# Patient Record
Sex: Male | Born: 1990
Health system: Southern US, Community
[De-identification: ages and names within clinical notes are randomized; demographics above are authoritative.]

## PROBLEM LIST (undated history)

## (undated) DIAGNOSIS — A749 Chlamydial infection, unspecified: Secondary | ICD-10-CM

## (undated) DIAGNOSIS — A64 Unspecified sexually transmitted disease: Secondary | ICD-10-CM

---

## 2011-07-17 ENCOUNTER — Emergency Department (HOSPITAL_BASED_OUTPATIENT_CLINIC_OR_DEPARTMENT_OTHER)
Admission: EM | Admit: 2011-07-17 | Discharge: 2011-07-17 | Disposition: A | Discharge status: Home / Self Care | Payer: Self-pay | Attending: Emergency Medicine | Admitting: Emergency Medicine

## 2011-07-17 ENCOUNTER — Encounter: Payer: Self-pay | Admitting: Emergency Medicine

## 2011-07-17 DIAGNOSIS — R300 Dysuria: Secondary | ICD-10-CM | POA: Insufficient documentation

## 2011-07-17 DIAGNOSIS — N342 Other urethritis: Secondary | ICD-10-CM | POA: Insufficient documentation

## 2011-07-17 LAB — URINALYSIS, ROUTINE W REFLEX MICROSCOPIC
Bilirubin Urine: NEGATIVE
Glucose, UA: NEGATIVE mg/dL
Hgb urine dipstick: NEGATIVE
Ketones, ur: 40 mg/dL — AB
Leukocytes, UA: NEGATIVE
Nitrite: NEGATIVE
Protein, ur: NEGATIVE mg/dL
Specific Gravity, Urine: 1.023 (ref 1.005–1.030)
Urobilinogen, UA: 1 mg/dL (ref 0.0–1.0)
pH: 6.5 (ref 5.0–8.0)

## 2011-07-17 MED ORDER — AZITHROMYCIN 1 G PO PACK: 1.0000 g | PACK | Freq: Once | ORAL | Status: DC

## 2011-07-17 MED ORDER — CEFTRIAXONE SODIUM 250 MG IJ SOLR
250.0000 mg | Freq: Once | INTRAMUSCULAR | Status: AC
  Administered 2011-07-17: 250 mg via INTRAMUSCULAR
  Filled 2011-07-17: qty 250

## 2011-07-17 MED ORDER — AZITHROMYCIN 250 MG PO TABS
ORAL_TABLET | ORAL | Status: AC
  Administered 2011-07-17: 1000 mg via ORAL
  Filled 2011-07-17: qty 4

## 2011-07-17 MED ORDER — AZITHROMYCIN 250 MG PO TABS
1000.0000 mg | ORAL_TABLET | Freq: Once | ORAL | Status: AC
  Administered 2011-07-17: 1000 mg via ORAL

## 2011-07-17 MED ORDER — LIDOCAINE HCL (PF) 1 % IJ SOLN
INTRAMUSCULAR | Status: AC
  Administered 2011-07-17: 2.5 mL via INTRAMUSCULAR
  Filled 2011-07-17: qty 5

## 2011-07-17 NOTE — ED Notes (Signed)
Pt states he has had unprotected sex recently.

## 2011-07-17 NOTE — ED Notes (Addendum)
Urethral burning with more sensation on urination.  No penal discharge.  No known fever.  No other symptoms

## 2011-07-17 NOTE — ED Provider Notes (Signed)
History   20yM with dysuria. Uncomfortablel sensation in penis and worse when voids. No abdominal pain. No testicular pain or swelling. No penile discharge. No hematuria or difficulty voiding.No rash.  NO back or flank pain. No fever or chills. No n/v. Recent unprotected sex and concerned for sexually transmitted disease. Otherwise healthy.  CSN: [id number] Arrival date & time: 07/17/2011 10:40 AM  Chief Complaint  Patient presents with  . Dysuria   HPI  History reviewed. No pertinent past medical history.  History reviewed. No pertinent past surgical history.  History reviewed. No pertinent family history.  History  Substance Use Topics  . Smoking status: Never Smoker   . Smokeless tobacco: Not on file  . Alcohol Use: Yes     occasioanal      Review of Systems  Constitutional: Negative.   Respiratory: Negative.   Cardiovascular: Negative.   Gastrointestinal: Negative for nausea, vomiting, abdominal pain, diarrhea and abdominal distention.  Genitourinary: Positive for dysuria. Negative for frequency, hematuria, flank pain, discharge, penile swelling, scrotal swelling, genital sores and testicular pain.  Musculoskeletal: Negative for back pain.  Skin: Negative for rash.  Psychiatric/Behavioral: Negative.   All other systems reviewed and are negative.    Physical Exam  BP 128/77  Pulse 99  Temp(Src) 98.3 F (36.8 C) (Oral)  Resp 16  Ht 6\' 1"  (1.854 m)  SpO2 100%  Physical Exam  Constitutional: He appears well-developed and well-nourished.  HENT:  Head: Normocephalic and atraumatic.  Eyes: Conjunctivae are normal.  Cardiovascular: Normal rate, regular rhythm and normal heart sounds.   Pulmonary/Chest: Effort normal and breath sounds normal.  Abdominal: Soft. He exhibits no distension and no mass. There is no tenderness. Hernia confirmed negative in the right inguinal area and confirmed negative in the left inguinal area.  Genitourinary: Testes normal and penis  normal. Circumcised.  Lymphadenopathy:       Right: No inguinal adenopathy present.       Left: No inguinal adenopathy present.  Neurological: He is alert.  Skin: Skin is warm and dry.    ED Course  Procedures  Results for orders placed during the hospital encounter of 07/17/11  URINALYSIS, ROUTINE W REFLEX MICROSCOPIC      Component Value Range   Color, Urine YELLOW  YELLOW    Appearance CLEAR  CLEAR    Specific Gravity, Urine 1.023  1.005 - 1.030    pH 6.5  5.0 - 8.0    Glucose, UA NEGATIVE  NEGATIVE (mg/dL)   Hgb urine dipstick NEGATIVE  NEGATIVE    Bilirubin Urine NEGATIVE  NEGATIVE    Ketones, ur 40 (*) NEGATIVE (mg/dL)   Protein, ur NEGATIVE  NEGATIVE (mg/dL)   Urobilinogen, UA 1.0  0.0 - 1.0 (mg/dL)   Nitrite NEGATIVE  NEGATIVE    Leukocytes, UA NEGATIVE  NEGATIVE      MDM 20yM with dysuria. Urine not suggestive of UTI. Hx of unprotected sex and concerned for STD. Will tx empirically. Abdominal and testicular exam benign.      Raeford Razor, MD 07/19/11 [phone]

## 2011-07-19 LAB — GC/CHLAMYDIA PROBE AMP, GENITAL
Chlamydia, DNA Probe: NEGATIVE
GC Probe Amp, Genital: NEGATIVE

## 2011-12-21 ENCOUNTER — Emergency Department (HOSPITAL_BASED_OUTPATIENT_CLINIC_OR_DEPARTMENT_OTHER)
Admission: EM | Admit: 2011-12-21 | Discharge: 2011-12-21 | Disposition: A | Discharge status: Home / Self Care | Payer: Self-pay | Attending: Emergency Medicine | Admitting: Emergency Medicine

## 2011-12-21 ENCOUNTER — Encounter (HOSPITAL_BASED_OUTPATIENT_CLINIC_OR_DEPARTMENT_OTHER): Payer: Self-pay | Admitting: *Deleted

## 2011-12-21 DIAGNOSIS — Z202 Contact with and (suspected) exposure to infections with a predominantly sexual mode of transmission: Secondary | ICD-10-CM | POA: Insufficient documentation

## 2011-12-21 LAB — URINALYSIS, ROUTINE W REFLEX MICROSCOPIC
Bilirubin Urine: NEGATIVE
Hgb urine dipstick: NEGATIVE
Nitrite: NEGATIVE
Specific Gravity, Urine: 1.025 (ref 1.005–1.030)
pH: 7.5 (ref 5.0–8.0)

## 2011-12-21 MED ORDER — METRONIDAZOLE 500 MG PO TABS: 500.0000 mg | ORAL_TABLET | Freq: Two times a day (BID) | ORAL | Status: AC

## 2011-12-21 MED ORDER — METRONIDAZOLE 500 MG PO TABS
500.0000 mg | ORAL_TABLET | Freq: Once | ORAL | Status: AC
  Administered 2011-12-21: 500 mg via ORAL
  Filled 2011-12-21: qty 1

## 2011-12-21 NOTE — ED Notes (Signed)
D/c home with rx x 1 for flagyl

## 2011-12-21 NOTE — ED Provider Notes (Signed)
History     CSN: [id number]  Arrival date & time 12/21/11  1712   First MD Initiated Contact with Patient 12/21/11 1753      Chief Complaint  Patient presents with  . Exposure to STD    (Consider location/radiation/quality/duration/timing/severity/associated sxs/prior treatment) HPI Patient presents with concern for STD exposure. He states that his girlfriend sent him a text message saying that she was diagnosed with trichomonas. Patient states that he became very anxious and felt some diffuse abdominal pain after getting a text message. He denies any dysuria or penile discharge. He's had no fever no flank pain or vomiting. He has no history of STDs. He is asymptomatic at this time.  There are no alleviating or modifying factors.  He received the message today  History reviewed. No pertinent past medical history.  History reviewed. No pertinent past surgical history.  History reviewed. No pertinent family history.  History  Substance Use Topics  . Smoking status: Never Smoker   . Smokeless tobacco: Not on file  . Alcohol Use: Yes     occasioanal      Review of Systems ROS reviewed and otherwise negative except for mentioned in HPI  Allergies  Review of patient's allergies indicates no known allergies.  Home Medications   Current Outpatient Rx  Name Route Sig Dispense Refill  . METRONIDAZOLE 500 MG PO TABS Oral Take 1 tablet (500 mg total) by mouth 2 (two) times daily. 14 tablet 0    BP 137/82  Pulse 74  Temp(Src) 98.5 F (36.9 C) (Oral)  Resp 18  Ht 6\' 1"  (1.854 m)  Wt 160 lb (72.576 kg)  BMI 21.11 kg/m2  SpO2 99% Vitals reviewed Physical Exam Physical Examination: General appearance - alert, well appearing, and in no distress Mental status - alert, oriented to person, place, and time Chest - clear to auscultation, no wheezes, rales or rhonchi, symmetric air entry Heart - normal rate, regular rhythm, normal S1, S2, no murmurs, rubs, clicks or  gallops Abdomen - soft, nontender, nondistended, no masses or organomegaly Extremities - peripheral pulses normal, no pedal edema, no clubbing or cyanosis Skin - normal coloration and turgor, no rashes  ED Course  Procedures (including critical care time)  Labs Reviewed  URINALYSIS, ROUTINE W REFLEX MICROSCOPIC - Abnormal; Notable for the following:    APPearance CLOUDY (*)    All other components within normal limits  GC/CHLAMYDIA PROBE AMP, URINE   No results found.   1. Exposure to STD       MDM  Patient presents after exposure to Trichomonas. His urine is negative. Will treat for trichomonas due to known exposure. GC and Chlamydia is pending. Patient was discharged with strict return precautions and is agreeable with this plan.        Ethelda Chick, MD 12/23/11 1537

## 2011-12-21 NOTE — Discharge Instructions (Signed)
Return to the ED with any concerns including fever, vomiting and not able to finish her medication, or any other alarming symptoms.  A culture for gonorrhea and chlamydia is pending- you will be contacted if this test is positive.

## 2011-12-21 NOTE — ED Notes (Signed)
Pt states girl friend just dx tric and wants to be checked

## 2011-12-23 LAB — GC/CHLAMYDIA PROBE AMP, URINE
Chlamydia, Swab/Urine, PCR: NEGATIVE
GC Probe Amp, Urine: NEGATIVE

## 2012-03-27 ENCOUNTER — Encounter (HOSPITAL_COMMUNITY): Payer: Self-pay | Admitting: Emergency Medicine

## 2012-03-27 ENCOUNTER — Emergency Department (HOSPITAL_COMMUNITY)
Admission: EM | Admit: 2012-03-27 | Discharge: 2012-03-27 | Disposition: A | Discharge status: Home / Self Care | Payer: Self-pay | Attending: Emergency Medicine | Admitting: Emergency Medicine

## 2012-03-27 DIAGNOSIS — R300 Dysuria: Secondary | ICD-10-CM | POA: Insufficient documentation

## 2012-03-27 LAB — URINALYSIS, ROUTINE W REFLEX MICROSCOPIC
Bilirubin Urine: NEGATIVE
Glucose, UA: NEGATIVE mg/dL
Hgb urine dipstick: NEGATIVE
Leukocytes, UA: NEGATIVE
Nitrite: NEGATIVE
Protein, ur: NEGATIVE mg/dL
Specific Gravity, Urine: 1.018 (ref 1.005–1.030)
Urobilinogen, UA: 1 mg/dL (ref 0.0–1.0)
pH: 6.5 (ref 5.0–8.0)

## 2012-03-27 NOTE — ED Notes (Signed)
Pt reports l/flank pain, abd pain and burning sensation in penis post ejaculation

## 2012-03-27 NOTE — Discharge Instructions (Signed)
Christian Beltran you do not have a UTI or trichomonias in your urine today.  You will get a call if you have a +STD test today.  Do not have intercourse until you hear something.  There is a free STD clinic at the health department.  Return for severe pain, SOB or other concerns.  Dysuria You have dysuria. This is pain on urination. Dysuria is often present with other symptoms such as:  A sudden urge to go.   Having to go more often.  Dysuria can be caused by:  Urinary tract infections.   Yeast infections.   Prostate problems.   Urinary stones.   Sexually transmitted diseases.  Lab tests of the urine will usually be needed to confirm a urinary infection. An infection is the cause of dysuria in over half the cases. In older men the prostate gland enlarges and can cause urinary problems. These include:   Urinary obstruction.   Infection.   Pain on urination.  Bladder cancer can also cause blood in the urine and dysuria. If you have an infection, be sure to take the antibiotics prescribed for you until they are gone. This will help prevent a recurrence. Further checking by a specialist may be needed if the cause of your dysuria is not found. Cystoscopy, x-rays, pelvic exams, and special cultures may be needed to find the cause and help find the best treatment. See your caregiver right away if your symptoms are not improved after three days.  SEEK IMMEDIATE MEDICAL CARE IF:  You have difficulty urinating, pass bloody urine, or have chills or a fever. Document Released: 10/25/2005 Document Revised: 10/14/2011 Document Reviewed: 04/11/2007 [facility] Patient Information 2012 [place], [place].Dysuria You have dysuria. This is pain on urination. Dysuria is often present with other symptoms such as:  A sudden urge to go.   Having to go more often.  Dysuria can be caused by:  Urinary tract infections.   Yeast infections.   Prostate problems.   Urinary stones.   Sexually transmitted  diseases.  Lab tests of the urine will usually be needed to confirm a urinary infection. An infection is the cause of dysuria in over half the cases. In older men the prostate gland enlarges and can cause urinary problems. These include:   Urinary obstruction.   Infection.   Pain on urination.  Bladder cancer can also cause blood in the urine and dysuria. If you have an infection, be sure to take the antibiotics prescribed for you until they are gone. This will help prevent a recurrence. Further checking by a specialist may be needed if the cause of your dysuria is not found. Cystoscopy, x-rays, pelvic exams, and special cultures may be needed to find the cause and help find the best treatment. See your caregiver right away if your symptoms are not improved after three days.  SEEK IMMEDIATE MEDICAL CARE IF:  You have difficulty urinating, pass bloody urine, or have chills or a fever. Document Released: 10/25/2005 Document Revised: 10/14/2011 Document Reviewed: 04/11/2007 [facility] Patient Information 2012 [place], [place].

## 2012-03-27 NOTE — ED Provider Notes (Signed)
History     CSN: [id number]  Arrival date & time 03/27/12  1232   First MD Initiated Contact with Patient 03/27/12 1244      No chief complaint on file.   (Consider location/radiation/quality/duration/timing/severity/associated sxs/prior treatment) Patient is a 21 y.o. male presenting with STD exposure. The history is provided by the patient. No language interpreter was used.  Exposure to STD This is a recurrent problem. The current episode started more than 1 month ago. The problem occurs constantly. The problem has been gradually worsening. Associated symptoms include urinary symptoms. Pertinent negatives include no fever, nausea, sore throat, swollen glands or vomiting. Exacerbated by: voiding.   Reports intermittent burning with urination. States that he was treated for Trichomonas but did not finish the medication 2 months ago. Would like to be  tested today and called if test is positive. No penile discharge.  One partner. Unprotected sex.  States his partner has no symptoms and just started her period.    No past medical history on file.  No past surgical history on file.  No family history on file.  History  Substance Use Topics  . Smoking status: Not on file  . Smokeless tobacco: Not on file  . Alcohol Use: Not on file      Review of Systems  Constitutional: Negative.  Negative for fever.  HENT: Negative.  Negative for sore throat.   Eyes: Negative.   Respiratory: Negative.   Cardiovascular: Negative.   Gastrointestinal: Negative.  Negative for nausea and vomiting.  Genitourinary: Positive for dysuria. Negative for hematuria, flank pain, penile swelling, scrotal swelling, enuresis, difficulty urinating, genital sores, penile pain and testicular pain.  Neurological: Negative.   Psychiatric/Behavioral: Negative.   All other systems reviewed and are negative.    Allergies  Review of patient's allergies indicates no known allergies.  Home Medications   Current  Outpatient Rx  Name Route Sig Dispense Refill  . CORICIDIN HBP COLD/FLU PO Oral Take 1 tablet by mouth every 6 (six) hours.      There were no vitals taken for this visit.  Physical Exam  Nursing note and vitals reviewed. Constitutional: He is oriented to person, place, and time. He appears well-developed and well-nourished.  HENT:  Head: Normocephalic.  Eyes: Conjunctivae and EOM are normal. Pupils are equal, round, and reactive to light.  Neck: Normal range of motion. Neck supple.  Cardiovascular: Normal rate.   Pulmonary/Chest: Effort normal.  Abdominal: Soft. He exhibits no distension.  Genitourinary: Testes normal and penis normal. Circumcised. No phimosis, hypospadias, penile erythema or penile tenderness. No discharge found.  Musculoskeletal: Normal range of motion.  Neurological: He is alert and oriented to person, place, and time.  Skin: Skin is warm and dry.  Psychiatric: He has a normal mood and affect.    ED Course  Procedures (including critical care time)  Labs Reviewed - No data to display No results found.   No diagnosis found.    MDM  Dysuria intermittant.  Was treated for trich 2 months ago but did not finish meds. GC/chlymidia pending.  No trich in urine or infection.  Pending results will be called if positive.  Educated on free std clinic at [facility] de[facility]         Remi Haggard, NP 03/27/12 1956

## 2012-03-28 ENCOUNTER — Emergency Department (HOSPITAL_BASED_OUTPATIENT_CLINIC_OR_DEPARTMENT_OTHER)
Admission: EM | Admit: 2012-03-28 | Discharge: 2012-03-28 | Disposition: A | Discharge status: Home / Self Care | Payer: Self-pay | Attending: Emergency Medicine | Admitting: Emergency Medicine

## 2012-03-28 ENCOUNTER — Encounter (HOSPITAL_BASED_OUTPATIENT_CLINIC_OR_DEPARTMENT_OTHER): Payer: Self-pay | Admitting: *Deleted

## 2012-03-28 ENCOUNTER — Emergency Department (HOSPITAL_BASED_OUTPATIENT_CLINIC_OR_DEPARTMENT_OTHER): Payer: Self-pay

## 2012-03-28 DIAGNOSIS — R079 Chest pain, unspecified: Secondary | ICD-10-CM | POA: Insufficient documentation

## 2012-03-28 DIAGNOSIS — R0609 Other forms of dyspnea: Secondary | ICD-10-CM | POA: Insufficient documentation

## 2012-03-28 DIAGNOSIS — F411 Generalized anxiety disorder: Secondary | ICD-10-CM | POA: Insufficient documentation

## 2012-03-28 DIAGNOSIS — F419 Anxiety disorder, unspecified: Secondary | ICD-10-CM

## 2012-03-28 DIAGNOSIS — R0602 Shortness of breath: Secondary | ICD-10-CM | POA: Insufficient documentation

## 2012-03-28 DIAGNOSIS — R0989 Other specified symptoms and signs involving the circulatory and respiratory systems: Secondary | ICD-10-CM | POA: Insufficient documentation

## 2012-03-28 LAB — GC/CHLAMYDIA PROBE AMP, GENITAL
Chlamydia, DNA Probe: NEGATIVE
GC Probe Amp, Genital: NEGATIVE

## 2012-03-28 MED ORDER — LORAZEPAM 1 MG PO TABS
1.0000 mg | ORAL_TABLET | Freq: Once | ORAL | Status: AC
  Administered 2012-03-28: 1 mg via ORAL
  Filled 2012-03-28: qty 1

## 2012-03-28 NOTE — ED Provider Notes (Signed)
Medical screening examination/treatment/procedure(s) were performed by non-physician practitioner and as supervising physician I was immediately available for consultation/collaboration.   Jamelle Noy M Haru Anspaugh, MD 03/28/12 1323 

## 2012-03-28 NOTE — ED Notes (Signed)
Pt was playing basketball tonight when he had a sudden onset of left sided chest/rib pains. Pt went into the house, and friends noted that he was clammy and sweaty. Upon arrival to ER, pt was hyperventilating and appeared to be having a panic attack. Pt coached on breathing and relaxation techniques. Symptoms now starting to improve.

## 2012-03-28 NOTE — ED Provider Notes (Signed)
Medical screening examination/treatment/procedure(s) were performed by non-physician practitioner and as supervising physician I was immediately available for consultation/collaboration.  Khyre Germond, MD 03/28/12 2353 

## 2012-03-28 NOTE — ED Notes (Signed)
NP at bedside.

## 2012-03-28 NOTE — ED Notes (Signed)
Pt admits to smoking marijuana almost every single day for the past 3 years, but recently stopped 5 days ago.

## 2012-03-28 NOTE — ED Notes (Signed)
Pt warm to touch and diaphoretic.  Alert and oriented in NAD. Denies pain.  Breath sounds clear.  Has just vomited.

## 2012-03-28 NOTE — ED Provider Notes (Signed)
History     CSN: [id number]  Arrival date & time 03/28/12  2033   First MD Initiated Contact with Patient 03/28/12 2104      Chief Complaint  Patient presents with  . Panic Attack    (Consider location/radiation/quality/duration/timing/severity/associated sxs/prior treatment) HPI Comments: Pt states that he was playing basketball when he had onset of rib pain and sob:pt states that he couldn't stop his breathing:pt states that he stopped using marijuana 5 days ago:pt states that he got clammy:pt states that he took vyvanse earlier today which is not his:pt states that he had tingling in his fingers  Patient is a 21 y.o. male presenting with shortness of breath. The history is provided by the patient. No language interpreter was used.  Shortness of Breath  The current episode started today. The onset was sudden. The problem occurs continuously. The problem has been unchanged. The problem is mild. The symptoms are relieved by nothing. Associated symptoms include chest pressure and shortness of breath. Pertinent negatives include no fever.    History reviewed. No pertinent past medical history.  History reviewed. No pertinent past surgical history.  No family history on file.  History  Substance Use Topics  . Smoking status: Never Smoker   . Smokeless tobacco: Not on file  . Alcohol Use: Yes     occasioanal      Review of Systems  Constitutional: Negative for fever.  Respiratory: Positive for shortness of breath.   Cardiovascular: Negative.   Neurological: Negative.     Allergies  Review of patient's allergies indicates no known allergies.  Home Medications   Current Outpatient Rx  Name Route Sig Dispense Refill  . LISDEXAMFETAMINE DIMESYLATE 30 MG PO CAPS Oral Take 30 mg by mouth every morning.      BP 136/80  Pulse 97  Temp(Src) 97.8 F (36.6 C) (Oral)  Resp 19  Ht 6' (1.829 m)  Wt 140 lb (63.504 kg)  BMI 18.99 kg/m2  SpO2 100%  Physical Exam  Nursing  note and vitals reviewed. Constitutional: He is oriented to person, place, and time. He appears well-developed and well-nourished.  HENT:  Head: Normocephalic and atraumatic.  Eyes: Conjunctivae and EOM are normal.  Neck: Normal range of motion. Neck supple.  Cardiovascular: Normal rate and regular rhythm.   Pulmonary/Chest: Effort normal and breath sounds normal.  Musculoskeletal: Normal range of motion.  Neurological: He is alert and oriented to person, place, and time.  Skin: Skin is warm and dry.  Psychiatric: His mood appears anxious.    ED Course  Procedures (including critical care time)  Labs Reviewed - No data to display Dg Chest 2 View  03/28/2012  *RADIOLOGY REPORT*  Clinical Data: Mid chest pain and difficulty breathing after playing basketball.  CHEST - 2 VIEW 03/28/2012:  Comparison: None.  Findings: Cardiomediastinal silhouette unremarkable.  Lungs clear. Bronchovascular markings normal.  Pulmonary vascularity normal.  No pleural effusions.  No pneumothorax.  Visualized bony thorax intact.  IMPRESSION: Normal examination.  Original Report Authenticated By: Arnell Sieving, M.D.     1. Anxiety       MDM  Pt is feeling better after the ativan:discussed marijuana abuse and using others medications:no sign of pneumothorax noted on x-ray        Teressa Lower, NP 03/28/12 2214

## 2012-03-28 NOTE — Discharge Instructions (Signed)

## 2012-03-30 ENCOUNTER — Emergency Department (HOSPITAL_BASED_OUTPATIENT_CLINIC_OR_DEPARTMENT_OTHER)
Admission: EM | Admit: 2012-03-30 | Discharge: 2012-03-31 | Disposition: A | Discharge status: Home / Self Care | Payer: Self-pay | Attending: Emergency Medicine | Admitting: Emergency Medicine

## 2012-03-30 ENCOUNTER — Encounter (HOSPITAL_BASED_OUTPATIENT_CLINIC_OR_DEPARTMENT_OTHER): Payer: Self-pay | Admitting: *Deleted

## 2012-03-30 DIAGNOSIS — F1210 Cannabis abuse, uncomplicated: Secondary | ICD-10-CM | POA: Insufficient documentation

## 2012-03-30 DIAGNOSIS — F988 Other specified behavioral and emotional disorders with onset usually occurring in childhood and adolescence: Secondary | ICD-10-CM | POA: Insufficient documentation

## 2012-03-30 NOTE — ED Notes (Signed)
Pt. Constantly moving in triage.  Pt. Reports smoking marajuana approx. 1 1/2 hrs ago and the pain in his chest got worse.  No cough noted and no nausea or vomiting noted.  Pt. Reports he has been seen [facility] and at [facility] this week for same pain.

## 2012-03-31 ENCOUNTER — Emergency Department (HOSPITAL_BASED_OUTPATIENT_CLINIC_OR_DEPARTMENT_OTHER): Payer: Self-pay

## 2012-03-31 LAB — RAPID URINE DRUG SCREEN, HOSP PERFORMED
Amphetamines: NOT DETECTED
Opiates: NOT DETECTED

## 2012-03-31 NOTE — ED Provider Notes (Signed)
History     CSN: [id number]  Arrival date & time 03/30/12  2309   First MD Initiated Contact with Patient 03/31/12 0059      Chief Complaint  Patient presents with  . Chest Pain    Pt. reports he has had L lower abd pain and tonight he has L side chest pain.      (Consider location/radiation/quality/duration/timing/severity/associated sxs/prior treatment) Patient is a 21 y.o. male presenting with drug problem. The history is provided by the patient. No language interpreter was used.  Drug Problem This is a recurrent problem. The current episode started 1 to 2 hours ago. The problem occurs constantly. The problem has not changed since onset.Pertinent negatives include no abdominal pain, no headaches and no shortness of breath. The symptoms are aggravated by nothing. The symptoms are relieved by nothing. He has tried nothing for the symptoms. The treatment provided no relief.  Left clavicular area pain after smoking marijuana.  No SOB.  No cough.  No tachycardia but feels weird, was seen for same 2 days ago.    Past Medical History  Diagnosis Date  . Attention deficit disorder     No past surgical history on file.  No family history on file.  History  Substance Use Topics  . Smoking status: Never Smoker   . Smokeless tobacco: Not on file  . Alcohol Use: Yes     occasioanal      Review of Systems  Respiratory: Negative for shortness of breath.   Gastrointestinal: Negative for abdominal pain.  Neurological: Negative for headaches.  All other systems reviewed and are negative.    Allergies  Review of patient's allergies indicates no known allergies.  Home Medications   Current Outpatient Rx  Name Route Sig Dispense Refill  . LISDEXAMFETAMINE DIMESYLATE 30 MG PO CAPS Oral Take 30 mg by mouth once.      BP 143/67  Pulse 123  Temp(Src) 98.6 F (37 C) (Oral)  Resp 22  Ht 6\' 1"  (1.854 m)  Wt 165 lb (74.844 kg)  BMI 21.77 kg/m2  SpO2 100%  Physical Exam    Constitutional: He is oriented to person, place, and time. He appears well-developed and well-nourished. No distress.  HENT:  Head: Normocephalic and atraumatic.  Mouth/Throat: Oropharynx is clear and moist.  Eyes: Conjunctivae are normal. Pupils are equal, round, and reactive to light.  Neck: Normal range of motion. Neck supple. No tracheal deviation present.  Cardiovascular: Normal rate and regular rhythm.   Pulmonary/Chest: Effort normal and breath sounds normal. No stridor. He has no wheezes. He has no rales.  Abdominal: Soft. Bowel sounds are normal. There is no tenderness. There is no rebound and no guarding.  Musculoskeletal: Normal range of motion. He exhibits no edema and no tenderness.  Lymphadenopathy:    He has no cervical adenopathy.  Neurological: He is alert and oriented to person, place, and time.  Skin: Skin is warm and dry. He is not diaphoretic.  Psychiatric: He has a normal mood and affect.    ED Course  Procedures (including critical care time)  Labs Reviewed  URINE RAPID DRUG SCREEN (HOSP PERFORMED) - Abnormal; Notable for the following:    Tetrahydrocannabinol POSITIVE (*)    All other components within normal limits   Dg Chest 2 View  03/31/2012  *RADIOLOGY REPORT*  Clinical Data: Left-sided chest pain and shortness of breath.  CHEST - 2 VIEW  Comparison: Two-view chest x-ray 03/28/2012.  Findings: Cardiomediastinal silhouette unremarkable.  Lungs clear.  Bronchovascular markings normal.  Pulmonary vascularity normal.  No pleural effusions.  No pneumothorax.  Visualized bony thorax intact.  No significant interval change.  IMPRESSION: Normal and stable examination.  Original Report Authenticated By: Arnell Sieving, M.D.     1. Marijuana abuse       MDM   Date: 03/31/2012  Rate: 55  Rhythm: sinus bradycardia  QRS Axis: right  Intervals: normal  ST/T Wave abnormalities: normal  Conduction Disutrbances:none  Narrative Interpretation:   Old EKG  Reviewed: none available   PERC negative.  Patient is clearly high.  Needs to follow up with his family doctor for ongoing care.  Sleeping soundly.  No indication for benzo. Stop using marijuana.  Patient verbalizes understanding       Mervin Ramires K Rileyann Florance-Rasch, MD 03/31/12 [phone]

## 2012-04-03 ENCOUNTER — Encounter (HOSPITAL_BASED_OUTPATIENT_CLINIC_OR_DEPARTMENT_OTHER): Payer: Self-pay | Admitting: Emergency Medicine

## 2012-08-27 ENCOUNTER — Emergency Department (HOSPITAL_BASED_OUTPATIENT_CLINIC_OR_DEPARTMENT_OTHER)
Admission: EM | Admit: 2012-08-27 | Discharge: 2012-08-27 | Disposition: A | Discharge status: Home / Self Care | Payer: Self-pay | Attending: Emergency Medicine | Admitting: Emergency Medicine

## 2012-08-27 ENCOUNTER — Encounter (HOSPITAL_BASED_OUTPATIENT_CLINIC_OR_DEPARTMENT_OTHER): Payer: Self-pay | Admitting: *Deleted

## 2012-08-27 DIAGNOSIS — R300 Dysuria: Secondary | ICD-10-CM | POA: Insufficient documentation

## 2012-08-27 DIAGNOSIS — F988 Other specified behavioral and emotional disorders with onset usually occurring in childhood and adolescence: Secondary | ICD-10-CM | POA: Insufficient documentation

## 2012-08-27 MED ORDER — AZITHROMYCIN 1 G PO PACK
1.0000 g | PACK | Freq: Once | ORAL | Status: AC
  Administered 2012-08-27: 1 g via ORAL
  Filled 2012-08-27: qty 1

## 2012-08-27 MED ORDER — LIDOCAINE HCL (PF) 1 % IJ SOLN
INTRAMUSCULAR | Status: AC
  Administered 2012-08-27: 5 mL
  Filled 2012-08-27: qty 5

## 2012-08-27 MED ORDER — CEFTRIAXONE SODIUM 250 MG IJ SOLR
250.0000 mg | Freq: Once | INTRAMUSCULAR | Status: AC
  Administered 2012-08-27: 250 mg via INTRAMUSCULAR
  Filled 2012-08-27: qty 250

## 2012-08-27 NOTE — ED Provider Notes (Signed)
History     CSN: [id number]  Arrival date & time 08/27/12  0121   First MD Initiated Contact with Patient 08/27/12 0203      Chief Complaint  Patient presents with  . Penis Pain    (Consider location/radiation/quality/duration/timing/severity/associated sxs/prior treatment) HPI Is a 21 year old male who complains of an irritation of the distal ventral penis just proximal to the glans. He attributes this to masturbating under some on clean covers 2 days ago. He denies urethral discharge but is having burning with urination. He denies abdominal pain, nausea, vomiting or diarrhea. He has some papules on the distal penis but states that those have been there long time and not acutely changed.  Past Medical History  Diagnosis Date  . Attention deficit disorder     History reviewed. No pertinent past surgical history.  No family history on file.  History  Substance Use Topics  . Smoking status: Never Smoker   . Smokeless tobacco: Never Used  . Alcohol Use: 8.4 oz/week    14 Cans of beer per week     occasioanal      Review of Systems  All other systems reviewed and are negative.    Allergies  Review of patient's allergies indicates no known allergies.  Home Medications   Current Outpatient Rx  Name Route Sig Dispense Refill  . LISDEXAMFETAMINE DIMESYLATE 30 MG PO CAPS Oral Take 30 mg by mouth once.      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 He was advised that should he test positive he will be contacted in sexual partners will need to be treated        Hanley Seamen, MD 08/27/12 [phone]

## 2012-08-27 NOTE — ED Notes (Signed)
Reports irritation on penis since Friday- states he came in contact with covers from a bed that may have irritated it- also c/o burning with urination

## 2012-08-29 LAB — GC/CHLAMYDIA PROBE AMP, GENITAL
Chlamydia, DNA Probe: POSITIVE — AB
GC Probe Amp, Genital: NEGATIVE

## 2012-08-30 NOTE — ED Notes (Signed)
+   Chlamydia Patient treated with rocephin and zithromax-Chart sent to EDP office for review.

## 2012-09-02 ENCOUNTER — Telehealth (HOSPITAL_COMMUNITY): Payer: Self-pay | Admitting: Emergency Medicine

## 2012-10-07 ENCOUNTER — Encounter (HOSPITAL_BASED_OUTPATIENT_CLINIC_OR_DEPARTMENT_OTHER): Payer: Self-pay | Admitting: Emergency Medicine

## 2012-10-07 ENCOUNTER — Emergency Department (HOSPITAL_BASED_OUTPATIENT_CLINIC_OR_DEPARTMENT_OTHER)
Admission: EM | Admit: 2012-10-07 | Discharge: 2012-10-07 | Disposition: A | Discharge status: Home / Self Care | Payer: Self-pay | Attending: Emergency Medicine | Admitting: Emergency Medicine

## 2012-10-07 DIAGNOSIS — R300 Dysuria: Secondary | ICD-10-CM | POA: Insufficient documentation

## 2012-10-07 DIAGNOSIS — N342 Other urethritis: Secondary | ICD-10-CM

## 2012-10-07 DIAGNOSIS — N341 Nonspecific urethritis: Secondary | ICD-10-CM | POA: Insufficient documentation

## 2012-10-07 DIAGNOSIS — Z8659 Personal history of other mental and behavioral disorders: Secondary | ICD-10-CM | POA: Insufficient documentation

## 2012-10-07 HISTORY — DX: Unspecified sexually transmitted disease: A64

## 2012-10-07 MED ORDER — CEFTRIAXONE SODIUM 250 MG IJ SOLR
250.0000 mg | Freq: Once | INTRAMUSCULAR | Status: AC
  Administered 2012-10-07: 250 mg via INTRAMUSCULAR
  Filled 2012-10-07: qty 250

## 2012-10-07 MED ORDER — AZITHROMYCIN 250 MG PO TABS
1000.0000 mg | ORAL_TABLET | Freq: Once | ORAL | Status: AC
  Administered 2012-10-07: 1000 mg via ORAL
  Filled 2012-10-07: qty 4

## 2012-10-07 MED ORDER — LIDOCAINE HCL (PF) 1 % IJ SOLN
INTRAMUSCULAR | Status: AC
  Administered 2012-10-07: 5 mL
  Filled 2012-10-07: qty 5

## 2012-10-07 NOTE — ED Notes (Signed)
Burning urination and pain to left side.  Girlfriend was tested for STD and was positive, pt wants shot.

## 2012-10-07 NOTE — Discharge Instructions (Signed)
Urethritis, Adult  Urethritis is an inflammation (soreness) of the urethra (the tube exiting from the bladder). It is often caused by germs that may be spread through sexual contact.  TREATMENT   Urethritis will usually respond to antibiotics. These are medications that kill germs. Take all the medicine given to you. You may feel better in a couple days, but TAKE ALL MEDICINE or the infection may not be completely cured and may become more difficult to treat. Response can generally be expected in 7 to 10 days. You may require additional treatment after more testing.  HOME CARE INSTRUCTIONS   Not have sex until the test results are known and treatment is completed.   Know that you may be asked to notify your sex partner when your final test results are back.   Finish all medications as prescribed.   Prevent sexually transmitted infections including AIDS. Practice safe sex. Use condoms.  SEEK MEDICAL CARE IF:    Your symptoms are not improved in 2 to 3 days.   Your symptoms are getting worse.   Your develop abdominal pain.   You develop joint pain.  SEEK IMMEDIATE MEDICAL CARE IF:    You have a fever.   You develop severe pain in the belly, back or side.   You develop repeated vomiting.  TEST RESULTS  Not all test results are available during your visit. If your test results are not back during the visit, make an appointment with your caregiver to find out the results. Do not assume everything is normal if you have not heard from your caregiver or the medical facility. It is important for you to follow-up on all of your test results.  Document Released: 04/20/2001 Document Revised: 01/17/2012 Document Reviewed: 11/10/2009  [employer] Patient Information 2013 [employer]

## 2012-10-07 NOTE — ED Provider Notes (Signed)
History  This chart was scribed for Christian Pound. Oletta Lamas, MD by Ardeen Jourdain, ED Scribe. This patient was seen in room MH11/MH11 and the patient's care was started at 1808.   CSN: [id number]  Arrival date & time 10/07/12  1744   First MD Initiated Contact with Patient 10/07/12 1808      Chief Complaint  Patient presents with  . Exposure to STD    The history is provided by the patient. No language interpreter was used.    Christian Beltran is a 21 y.o. male who presents to the Emergency Department complaining of a burning sensation when he urinates with associated pain to his left side and penile discharge. He reports the symptoms started 4 days ago and have been gradually worsening. He states his partner tested positive, so he is here for treatment. He is not c/o any other symptoms at this time. He has a h/o ADD. He is an occasional alcohol user but denies smoking.    Past Medical History  Diagnosis Date  . Attention deficit disorder   . STD (male)     History reviewed. No pertinent past surgical history.  History reviewed. No pertinent family history.  History  Substance Use Topics  . Smoking status: Never Smoker   . Smokeless tobacco: Never Used  . Alcohol Use: 8.4 oz/week    14 Cans of beer per week     Comment: occasioanal      Review of Systems  Allergies  Review of patient's allergies indicates no known allergies.  Home Medications  No current outpatient prescriptions on file.  Triage Vitals: BP 152/90  Pulse 99  Temp 98.3 F (36.8 C) (Oral)  Resp 16  Ht 6\' 1"  (1.854 m)  Wt 160 lb (72.576 kg)  BMI 21.11 kg/m2  SpO2 100%  Physical Exam  Nursing note and vitals reviewed. Constitutional: He is oriented to person, place, and time. He appears well-developed and well-nourished. No distress.  HENT:  Head: Normocephalic and atraumatic.  Eyes: EOM are normal. Pupils are equal, round, and reactive to light.  Neck: Normal range of motion. Neck supple. No  tracheal deviation present.  Cardiovascular: Normal rate, regular rhythm and normal heart sounds.   Pulmonary/Chest: Effort normal and breath sounds normal. No respiratory distress.  Abdominal: Soft. He exhibits no distension.  Genitourinary: Penis normal.       No rash, no ulcers, no tenderness or discharge  Musculoskeletal: Normal range of motion. He exhibits no edema.  Neurological: He is alert and oriented to person, place, and time.  Skin: Skin is warm and dry.  Psychiatric: He has a normal mood and affect. His behavior is normal.    ED Course  Procedures (including critical care time)  DIAGNOSTIC STUDIES: Oxygen Saturation is 100% on room air, normal by my interpretation.    COORDINATION OF CARE:  6:09 PM: Discussed treatment plan which includes a penile swab with pt at bedside and pt agreed to plan.     Labs Reviewed  GC/CHLAMYDIA PROBE AMP   No results found.   1. Urethritis       MDM  I personally performed the services described in this documentation, which was scribed in my presence. The recorded information has been reviewed and is accurate.   Pt with suspected STD exposure, wants test adn to be treated.  Describes burning with urination, left side groin pain and slight discharge.  No rash, ulcer, mass noted.     Christian Pound. Oletta Lamas, MD 10/07/12 1836

## 2012-10-10 LAB — GC/CHLAMYDIA PROBE AMP
CT Probe RNA: POSITIVE — AB
GC Probe RNA: POSITIVE — AB

## 2012-10-14 ENCOUNTER — Telehealth (HOSPITAL_COMMUNITY): Payer: Self-pay | Admitting: Emergency Medicine

## 2012-10-14 NOTE — ED Notes (Signed)
+  Gonorrhea. +Chlamydia. Patient treated with Rocephin and Zithromax. Per protocol MD. [facility] faxed. °

## 2012-11-13 ENCOUNTER — Emergency Department (HOSPITAL_BASED_OUTPATIENT_CLINIC_OR_DEPARTMENT_OTHER)
Admission: EM | Admit: 2012-11-13 | Discharge: 2012-11-13 | Disposition: A | Discharge status: Home / Self Care | Payer: Self-pay | Attending: Emergency Medicine | Admitting: Emergency Medicine

## 2012-11-13 ENCOUNTER — Encounter (HOSPITAL_BASED_OUTPATIENT_CLINIC_OR_DEPARTMENT_OTHER): Payer: Self-pay | Admitting: *Deleted

## 2012-11-13 DIAGNOSIS — R300 Dysuria: Secondary | ICD-10-CM | POA: Insufficient documentation

## 2012-11-13 DIAGNOSIS — Z8619 Personal history of other infectious and parasitic diseases: Secondary | ICD-10-CM | POA: Insufficient documentation

## 2012-11-13 DIAGNOSIS — F988 Other specified behavioral and emotional disorders with onset usually occurring in childhood and adolescence: Secondary | ICD-10-CM | POA: Insufficient documentation

## 2012-11-13 LAB — URINALYSIS, ROUTINE W REFLEX MICROSCOPIC
Glucose, UA: NEGATIVE mg/dL
Ketones, ur: 15 mg/dL — AB
Leukocytes, UA: NEGATIVE
Nitrite: NEGATIVE
Protein, ur: NEGATIVE mg/dL
Urobilinogen, UA: 1 mg/dL (ref 0.0–1.0)

## 2012-11-13 NOTE — ED Notes (Signed)
Pt reports dysuria x 3 weeks- had testing for STD which were neg- still having sx

## 2012-11-13 NOTE — ED Provider Notes (Signed)
History     CSN: [id number]  Arrival date & time 11/13/12  0002   First MD Initiated Contact with Patient 11/13/12 0123      Chief Complaint  Patient presents with  . Dysuria    (Consider location/radiation/quality/duration/timing/severity/associated sxs/prior treatment) Patient is a 22 y.o. male presenting with dysuria. The history is provided by the patient.  Dysuria  This is a chronic problem. The current episode started more than 1 week ago. The problem occurs intermittently. The problem has not changed since onset.The quality of the pain is described as burning. The pain is mild. There has been no fever. He is sexually active. Pertinent negatives include no sweats, no vomiting and no discharge. He has tried nothing for the symptoms.  Has been seen by clinic and swabbed for STI and was told tests were negative.  Denies new partners.    Past Medical History  Diagnosis Date  . Attention deficit disorder   . STD (male)     History reviewed. No pertinent past surgical history.  No family history on file.  History  Substance Use Topics  . Smoking status: Never Smoker   . Smokeless tobacco: Never Used  . Alcohol Use: 8.4 oz/week    14 Cans of beer per week     Comment: occasioanal      Review of Systems  Constitutional: Negative for fever.  Gastrointestinal: Negative for vomiting.  Genitourinary: Positive for dysuria. Negative for discharge.  All other systems reviewed and are negative.    Allergies  Review of patient's allergies indicates no known allergies.  Home Medications  No current outpatient prescriptions on file.  BP 131/90  Pulse 79  Temp 97.9 F (36.6 C) (Oral)  Resp 18  SpO2 100%  Physical Exam  Constitutional: He is oriented to person, place, and time. He appears well-developed and well-nourished. No distress.  HENT:  Head: Normocephalic and atraumatic.  Eyes: EOM are normal. Pupils are equal, round, and reactive to light.  Neck: Normal  range of motion. Neck supple.  Cardiovascular: Normal rate, regular rhythm and intact distal pulses.   Pulmonary/Chest: Effort normal and breath sounds normal. He has no wheezes.  Abdominal: Soft. Bowel sounds are normal. There is no tenderness. There is no rebound and no guarding.  Genitourinary: Penis normal.       Chaperone present  Musculoskeletal: Normal range of motion.  Neurological: He is alert and oriented to person, place, and time.  Skin: Skin is warm and dry.    ED Course  Procedures (including critical care time)  Labs Reviewed  URINALYSIS, ROUTINE W REFLEX MICROSCOPIC - Abnormal; Notable for the following:    Ketones, ur 15 (*)     All other components within normal limits  GC/CHLAMYDIA PROBE AMP   No results found.   1. Dysuria       MDM  States just called by clinic and all STD tests negative.  No discharge elects to be swabbed and wait as no new exposure       Montague Corella Smitty Cords, MD 11/13/12 [phone]

## 2012-11-14 LAB — GC/CHLAMYDIA PROBE AMP: CT Probe RNA: NEGATIVE

## 2012-12-13 ENCOUNTER — Encounter (HOSPITAL_BASED_OUTPATIENT_CLINIC_OR_DEPARTMENT_OTHER): Payer: Self-pay

## 2012-12-13 ENCOUNTER — Emergency Department (HOSPITAL_BASED_OUTPATIENT_CLINIC_OR_DEPARTMENT_OTHER)
Admission: EM | Admit: 2012-12-13 | Discharge: 2012-12-13 | Disposition: A | Discharge status: Home / Self Care | Payer: Self-pay | Attending: Emergency Medicine | Admitting: Emergency Medicine

## 2012-12-13 DIAGNOSIS — R197 Diarrhea, unspecified: Secondary | ICD-10-CM | POA: Insufficient documentation

## 2012-12-13 DIAGNOSIS — Z79899 Other long term (current) drug therapy: Secondary | ICD-10-CM | POA: Insufficient documentation

## 2012-12-13 DIAGNOSIS — Z202 Contact with and (suspected) exposure to infections with a predominantly sexual mode of transmission: Secondary | ICD-10-CM | POA: Insufficient documentation

## 2012-12-13 DIAGNOSIS — Z8619 Personal history of other infectious and parasitic diseases: Secondary | ICD-10-CM | POA: Insufficient documentation

## 2012-12-13 DIAGNOSIS — F988 Other specified behavioral and emotional disorders with onset usually occurring in childhood and adolescence: Secondary | ICD-10-CM | POA: Insufficient documentation

## 2012-12-13 MED ORDER — AZITHROMYCIN 1 G PO PACK
1.0000 g | PACK | Freq: Once | ORAL | Status: AC
  Administered 2012-12-13: 1 g via ORAL
  Filled 2012-12-13: qty 1

## 2012-12-13 MED ORDER — CEFTRIAXONE SODIUM 250 MG IJ SOLR
250.0000 mg | Freq: Once | INTRAMUSCULAR | Status: AC
  Administered 2012-12-13: 250 mg via INTRAMUSCULAR
  Filled 2012-12-13: qty 250

## 2012-12-13 NOTE — ED Provider Notes (Signed)
History     CSN: [id number]  Arrival date & time 12/13/12  1915   First MD Initiated Contact with Patient 12/13/12 1940      Chief Complaint  Patient presents with  . Exposure to STD    (Consider location/radiation/quality/duration/timing/severity/associated sxs/prior treatment) Patient is a 22 y.o. male presenting with STD exposure. The history is provided by the patient.  Exposure to STD   patient here after being exposed to an STD by X. partner. States he was diarrhea. He denies any dysuria or hematuria. No penile drainage or discharge. No fever or chills. He is currently asymptomatic  Past Medical History  Diagnosis Date  . Attention deficit disorder   . STD (male)     History reviewed. No pertinent past surgical history.  Family History  Problem Relation Age of Onset  . Cancer Other   . Rheum arthritis Other     History  Substance Use Topics  . Smoking status: Never Smoker   . Smokeless tobacco: Never Used  . Alcohol Use: 0.0 oz/week      Review of Systems  All other systems reviewed and are negative.    Allergies  Review of patient's allergies indicates no known allergies.  Home Medications   Current Outpatient Rx  Name  Route  Sig  Dispense  Refill  . CORICIDIN HBP COLD/FLU PO   Oral   Take 1 tablet by mouth every 6 (six) hours.           BP 139/88  Pulse 78  Temp 98.8 F (37.1 C) (Oral)  Resp 16  Ht 6\' 1"  (1.854 m)  Wt 165 lb (74.844 kg)  BMI 21.77 kg/m2  SpO2 100%  Physical Exam  Nursing note and vitals reviewed. Constitutional: He is oriented to person, place, and time. He appears well-developed and well-nourished.  Non-toxic appearance. No distress.  HENT:  Head: Normocephalic and atraumatic.  Eyes: Conjunctivae normal, EOM and lids are normal. Pupils are equal, round, and reactive to light.  Neck: Normal range of motion. Neck supple. No tracheal deviation present. No mass present.  Cardiovascular: Normal rate, regular rhythm  and normal heart sounds.  Exam reveals no gallop.   No murmur heard. Pulmonary/Chest: Effort normal and breath sounds normal. No stridor. No respiratory distress. He has no decreased breath sounds. He has no wheezes. He has no rhonchi. He has no rales.  Abdominal: Soft. Normal appearance and bowel sounds are normal. He exhibits no distension. There is no tenderness. There is no rebound and no CVA tenderness.  Genitourinary: Testes normal and penis normal. Circumcised. No discharge found.  Musculoskeletal: Normal range of motion. He exhibits no edema and no tenderness.  Neurological: He is alert and oriented to person, place, and time. He has normal strength. No cranial nerve deficit or sensory deficit. GCS eye subscore is 4. GCS verbal subscore is 5. GCS motor subscore is 6.  Skin: Skin is warm and dry. No abrasion and no rash noted.  Psychiatric: He has a normal mood and affect. His speech is normal and behavior is normal.    ED Course  Procedures (including critical care time)   Labs Reviewed  GC/CHLAMYDIA PROBE AMP   No results found.   No diagnosis found.    MDM  Patient given Rocephin and Zithromax and discharged        Toy Baker, MD 12/13/12 1950

## 2012-12-13 NOTE — ED Notes (Signed)
Ws notified of STD exposure-denies s/s

## 2013-01-07 ENCOUNTER — Encounter (HOSPITAL_COMMUNITY): Payer: Self-pay | Admitting: Emergency Medicine

## 2013-01-07 ENCOUNTER — Emergency Department (HOSPITAL_COMMUNITY)
Admission: EM | Admit: 2013-01-07 | Discharge: 2013-01-07 | Disposition: A | Discharge status: Home / Self Care | Payer: Self-pay | Attending: Emergency Medicine | Admitting: Emergency Medicine

## 2013-01-07 DIAGNOSIS — R369 Urethral discharge, unspecified: Secondary | ICD-10-CM | POA: Insufficient documentation

## 2013-01-07 DIAGNOSIS — R109 Unspecified abdominal pain: Secondary | ICD-10-CM | POA: Insufficient documentation

## 2013-01-07 DIAGNOSIS — R300 Dysuria: Secondary | ICD-10-CM | POA: Insufficient documentation

## 2013-01-07 DIAGNOSIS — Z202 Contact with and (suspected) exposure to infections with a predominantly sexual mode of transmission: Secondary | ICD-10-CM | POA: Insufficient documentation

## 2013-01-07 LAB — URINALYSIS, ROUTINE W REFLEX MICROSCOPIC
Nitrite: NEGATIVE
Specific Gravity, Urine: 1.02 (ref 1.005–1.030)
Urobilinogen, UA: 1 mg/dL (ref 0.0–1.0)
pH: 7.5 (ref 5.0–8.0)

## 2013-01-07 MED ORDER — LIDOCAINE HCL (PF) 1 % IJ SOLN
INTRAMUSCULAR | Status: AC
  Administered 2013-01-07: 2 mL
  Filled 2013-01-07: qty 5

## 2013-01-07 MED ORDER — CEFTRIAXONE SODIUM 250 MG IJ SOLR
250.0000 mg | Freq: Once | INTRAMUSCULAR | Status: AC
  Administered 2013-01-07: 250 mg via INTRAMUSCULAR
  Filled 2013-01-07: qty 250

## 2013-01-07 MED ORDER — AZITHROMYCIN 250 MG PO TABS
1000.0000 mg | ORAL_TABLET | Freq: Once | ORAL | Status: AC
  Administered 2013-01-07: 1000 mg via ORAL
  Filled 2013-01-07: qty 4

## 2013-01-07 NOTE — ED Provider Notes (Signed)
History  This chart was scribed for non-physician practitioner working with Ethelda Chick, MD by Erskine Emery, ED Scribe. This patient was seen in room TR07C/TR07C and the patient's care was started at 17:31.    CSN: [id number]  Arrival date & time 01/07/13  1542   None     Chief Complaint  Patient presents with  . Exposure to STD    (Consider location/radiation/quality/duration/timing/severity/associated sxs/prior treatment) The history is provided by the patient. No language interpreter was used.  Christian Beltran is a 22 y.o. male who presents to the Emergency Department complaining of white penile discharge, burning dysuria, and lower pelvic pain since this morning. Pt denies any associated hematuria or urinary retention, fever, vomiting, flank pain. Pt reports he had unprotected sex with someone he knows to have ghonorrhea and chlamydia yesterday. Pt has a h/o STDs, the last episode of which was 2 months ago.  Past Medical History  Diagnosis Date  . Attention deficit disorder   . STD (male)     History reviewed. No pertinent past surgical history.  Family History  Problem Relation Age of Onset  . Cancer Other   . Rheum arthritis Other     History  Substance Use Topics  . Smoking status: Never Smoker   . Smokeless tobacco: Never Used  . Alcohol Use: 0.0 oz/week      Review of Systems  Constitutional: Negative for fever and diaphoresis.  HENT: Negative for neck pain and neck stiffness.   Eyes: Negative for visual disturbance.  Respiratory: Negative for apnea and chest tightness.   Cardiovascular: Negative for palpitations.  Gastrointestinal: Negative for nausea, vomiting, abdominal pain, diarrhea and constipation.  Genitourinary: Positive for dysuria and discharge (white). Negative for flank pain, difficulty urinating, genital sores, penile pain and testicular pain.       Lower pubic pain  Musculoskeletal: Negative for gait problem.  Skin: Negative for rash.   Neurological: Negative for dizziness, weakness, light-headedness and numbness.  All other systems reviewed and are negative.    Allergies  Review of patient's allergies indicates no known allergies.  Home Medications  No current outpatient prescriptions on file.  Triage Vitals: BP 137/76  Pulse 85  Temp(Src) 97.8 F (36.6 C) (Oral)  Resp 20  SpO2 100%  Physical Exam  Nursing note and vitals reviewed. Constitutional: He is oriented to person, place, and time. He appears well-developed and well-nourished. No distress.  HENT:  Head: Normocephalic and atraumatic.  Eyes: Conjunctivae and EOM are normal.  Neck: Normal range of motion. Neck supple.  No meningeal signs  Cardiovascular: Normal rate, regular rhythm and normal heart sounds.  Exam reveals no gallop and no friction rub.   No murmur heard. Pulmonary/Chest: Effort normal and breath sounds normal. No respiratory distress. He has no wheezes. He has no rales. He exhibits no tenderness.  Abdominal: Soft. Bowel sounds are normal. He exhibits no distension. There is no tenderness.  Genitourinary:  No CVA tenderness  Musculoskeletal: Normal range of motion. He exhibits no edema and no tenderness.  5/5 strength throughout  Neurological: He is alert and oriented to person, place, and time. No cranial nerve deficit.  No focal deficits, sensation to light touch intact  Skin: Skin is warm and dry. He is not diaphoretic. No erythema.    ED Course  Procedures (including critical care time) DIAGNOSTIC STUDIES: Oxygen Saturation is 100% on room air, normal by my interpretation.    COORDINATION OF CARE: 17:31--I evaluated the patient and we discussed a  treatment plan including urinalysis and labs to which the pt agreed.   Labs Reviewed - No data to display No results found.   Diagnosis: exposure to STD    MDM  Pt understands GC/Chlamydia cultures pending and that they will need to inform all sexual partners within the last  6 months if results return positive. Pt has been treated prophylacticly with azithromycin and rocephin due to pts history and recent unprotected sexual encounter with a person known to have chladmydia. Pt advised that he will receive a call in 48 hours if the test is positive and to refrain from sexual activity for 48 hours. If the test is positive, pt is advised to refrain from sexual activity for 10 days for the medicine to take effect.  Discussed that because pt has had recent unprotected sex, might want to consider getting tested for HIV as well. Counseled pt that latex condoms are the only way to prevent against STDs or HIV.   I personally performed the services described in this documentation, which was scribed in my presence. The recorded information has been reviewed and is accurate.    Glade Nurse, PA-C 01/09/13 1355

## 2013-01-07 NOTE — ED Notes (Signed)
Pt reports possible exposure to STD. Pt with white discharge from penis and left flank pain. Pt has history of STD last time 2 months ago.

## 2013-01-08 LAB — GC/CHLAMYDIA PROBE AMP
CT Probe RNA: NEGATIVE
GC Probe RNA: NEGATIVE

## 2013-01-09 NOTE — ED Provider Notes (Signed)
Medical screening examination/treatment/procedure(s) were performed by non-physician practitioner and as supervising physician I was immediately available for consultation/collaboration.  Martha K Linker, MD 01/09/13 1359 

## 2013-03-16 ENCOUNTER — Emergency Department (HOSPITAL_BASED_OUTPATIENT_CLINIC_OR_DEPARTMENT_OTHER)
Admission: EM | Admit: 2013-03-16 | Discharge: 2013-03-16 | Disposition: A | Discharge status: Home / Self Care | Payer: Self-pay | Attending: Emergency Medicine | Admitting: Emergency Medicine

## 2013-03-16 ENCOUNTER — Encounter (HOSPITAL_BASED_OUTPATIENT_CLINIC_OR_DEPARTMENT_OTHER): Payer: Self-pay | Admitting: *Deleted

## 2013-03-16 DIAGNOSIS — N342 Other urethritis: Secondary | ICD-10-CM | POA: Insufficient documentation

## 2013-03-16 DIAGNOSIS — R300 Dysuria: Secondary | ICD-10-CM | POA: Insufficient documentation

## 2013-03-16 DIAGNOSIS — Z8659 Personal history of other mental and behavioral disorders: Secondary | ICD-10-CM | POA: Insufficient documentation

## 2013-03-16 DIAGNOSIS — Z8619 Personal history of other infectious and parasitic diseases: Secondary | ICD-10-CM | POA: Insufficient documentation

## 2013-03-16 HISTORY — DX: Chlamydial infection, unspecified: A74.9

## 2013-03-16 MED ORDER — CEFTRIAXONE SODIUM 250 MG IJ SOLR
250.0000 mg | Freq: Once | INTRAMUSCULAR | Status: AC
  Administered 2013-03-16: 250 mg via INTRAMUSCULAR
  Filled 2013-03-16: qty 250

## 2013-03-16 MED ORDER — AZITHROMYCIN 250 MG PO TABS
1000.0000 mg | ORAL_TABLET | Freq: Once | ORAL | Status: AC
  Administered 2013-03-16: 1000 mg via ORAL
  Filled 2013-03-16: qty 4

## 2013-03-16 MED ORDER — METRONIDAZOLE 500 MG PO TABS
2000.0000 mg | ORAL_TABLET | Freq: Once | ORAL | Status: AC
  Administered 2013-03-16: 2000 mg via ORAL
  Filled 2013-03-16: qty 4

## 2013-03-16 NOTE — ED Provider Notes (Signed)
History     CSN: [id number]  Arrival date & time 03/16/13  1859   First MD Initiated Contact with Patient 03/16/13 1921      Chief Complaint  Patient presents with  . Exposure to STD    (Consider location/radiation/quality/duration/timing/severity/associated sxs/prior treatment) HPI Comments: Patient wants an STD check. He thinks he has had an exposure. He has had chlamydia and gonorrhea before and he says it feels like he has again. He has had unprotected sex. Patient reports a burning sensation when he urinates. He has not noticed any lesions on his penis. No testicular pain or swelling. There is no urethral drainage.  Patient is a 22 y.o. male presenting with STD exposure.  Exposure to STD    Past Medical History  Diagnosis Date  . Attention deficit disorder   . STD (male)   . Chlamydia     History reviewed. No pertinent past surgical history.  Family History  Problem Relation Age of Onset  . Cancer Other   . Rheum arthritis Other     History  Substance Use Topics  . Smoking status: Never Smoker   . Smokeless tobacco: Never Used  . Alcohol Use: 0.0 oz/week      Review of Systems  Constitutional: Negative for fever.  Genitourinary: Positive for dysuria.    Allergies  Review of patient's allergies indicates no known allergies.  Home Medications  No current outpatient prescriptions on file.  BP 115/74  Pulse 91  Temp(Src) 99.5 F (37.5 C)  Resp 16  Ht 6\' 1"  (1.854 m)  Wt 150 lb (68.04 kg)  BMI 19.79 kg/m2  SpO2 100%  Physical Exam  Constitutional: He is oriented to person, place, and time. He appears well-developed and well-nourished. No distress.  HENT:  Head: Normocephalic and atraumatic.  Right Ear: Hearing normal.  Left Ear: Hearing normal.  Nose: Nose normal.  Mouth/Throat: Oropharynx is clear and moist and mucous membranes are normal.  Eyes: Conjunctivae and EOM are normal. Pupils are equal, round, and reactive to light.   Cardiovascular: S1 normal and S2 normal.   Abdominal: Soft. Normal appearance and bowel sounds are normal. There is no hepatosplenomegaly. There is no tenderness. There is no rebound, no guarding, no tenderness at McBurney's point and negative Murphy's sign. No hernia. Hernia confirmed negative in the right inguinal area and confirmed negative in the left inguinal area.  Genitourinary: Testes normal and penis normal. Right testis shows no mass and no tenderness. Left testis shows no mass and no tenderness. No penile tenderness. No discharge found.  Musculoskeletal: Normal range of motion.  Lymphadenopathy:       Right: No inguinal adenopathy present.       Left: No inguinal adenopathy present.  Neurological: He is alert and oriented to person, place, and time. He has normal strength. No cranial nerve deficit or sensory deficit. Coordination normal. GCS eye subscore is 4. GCS verbal subscore is 5. GCS motor subscore is 6.  Skin: Skin is warm, dry and intact. No rash noted. No cyanosis.  Psychiatric: He has a normal mood and affect. His speech is normal and behavior is normal. Thought content normal.    ED Course  Procedures (including critical care time)  Labs Reviewed - No data to display No results found.   Diagnosis: Urethritis    MDM  Patient comes to the ER with complaints of burning when he urinates similar to what he has had an STD in the past. He does report unprotected sex  with possible STD exposure. Patient to be treated empirically.       Gilda Crease, MD 03/16/13 [phone]

## 2013-03-16 NOTE — ED Notes (Signed)
Pt request STD check 

## 2013-04-25 ENCOUNTER — Encounter (HOSPITAL_BASED_OUTPATIENT_CLINIC_OR_DEPARTMENT_OTHER): Payer: Self-pay | Admitting: *Deleted

## 2013-04-25 ENCOUNTER — Emergency Department (HOSPITAL_BASED_OUTPATIENT_CLINIC_OR_DEPARTMENT_OTHER)
Admission: EM | Admit: 2013-04-25 | Discharge: 2013-04-25 | Disposition: A | Discharge status: Home / Self Care | Payer: Self-pay | Attending: Emergency Medicine | Admitting: Emergency Medicine

## 2013-04-25 DIAGNOSIS — R369 Urethral discharge, unspecified: Secondary | ICD-10-CM | POA: Insufficient documentation

## 2013-04-25 DIAGNOSIS — Z8659 Personal history of other mental and behavioral disorders: Secondary | ICD-10-CM | POA: Insufficient documentation

## 2013-04-25 DIAGNOSIS — Z202 Contact with and (suspected) exposure to infections with a predominantly sexual mode of transmission: Secondary | ICD-10-CM | POA: Insufficient documentation

## 2013-04-25 DIAGNOSIS — Z8619 Personal history of other infectious and parasitic diseases: Secondary | ICD-10-CM | POA: Insufficient documentation

## 2013-04-25 MED ORDER — CEFTRIAXONE SODIUM 250 MG IJ SOLR
250.0000 mg | Freq: Once | INTRAMUSCULAR | Status: AC
  Administered 2013-04-25: 250 mg via INTRAMUSCULAR
  Filled 2013-04-25: qty 250

## 2013-04-25 MED ORDER — AZITHROMYCIN 250 MG PO TABS
1000.0000 mg | ORAL_TABLET | Freq: Once | ORAL | Status: AC
  Administered 2013-04-25: 1000 mg via ORAL
  Filled 2013-04-25: qty 4

## 2013-04-25 NOTE — ED Notes (Signed)
Pt states had intercourse and condom broke wants to be checked

## 2013-04-25 NOTE — ED Provider Notes (Signed)
History     CSN: [id number]  Arrival date & time 04/25/13  1229   First MD Initiated Contact with Patient 04/25/13 1259      Chief Complaint  Patient presents with  . Exposure to STD    (Consider location/radiation/quality/duration/timing/severity/associated sxs/prior treatment) HPI Comments: Patient was having sex last week and the condom broke.  He started last night with discharge from his penis.  No dysuria.    Patient is a 22 y.o. male presenting with STD exposure. The history is provided by the patient.  Exposure to STD This is a new problem. The current episode started more than 1 week ago. The problem occurs constantly. The problem has been gradually worsening. Pertinent negatives include no abdominal pain. Associated symptoms comments: discharge. Nothing aggravates the symptoms. Nothing relieves the symptoms. He has tried nothing for the symptoms.    Past Medical History  Diagnosis Date  . Attention deficit disorder   . STD (male)   . Chlamydia     History reviewed. No pertinent past surgical history.  Family History  Problem Relation Age of Onset  . Cancer Other   . Rheum arthritis Other     History  Substance Use Topics  . Smoking status: Never Smoker   . Smokeless tobacco: Never Used  . Alcohol Use: 0.0 oz/week      Review of Systems  Gastrointestinal: Negative for abdominal pain.  All other systems reviewed and are negative.    Allergies  Review of patient's allergies indicates no known allergies.  Home Medications  No current outpatient prescriptions on file.  BP 132/85  Pulse 92  Temp(Src) 98.3 F (36.8 C) (Oral)  SpO2 99%  Physical Exam  Nursing note and vitals reviewed. Constitutional: He is oriented to person, place, and time. He appears well-developed and well-nourished. No distress.  HENT:  Head: Normocephalic and atraumatic.  Mouth/Throat: Oropharynx is clear and moist.  Neck: Normal range of motion. Neck supple.  Abdominal:  Soft. Bowel sounds are normal.  Genitourinary: Penis normal. No penile tenderness.  Neurological: He is alert and oriented to person, place, and time.  Skin: Skin is warm and dry. He is not diaphoretic.    ED Course  Procedures (including critical care time)  Labs Reviewed  GC/CHLAMYDIA PROBE AMP   No results found.   1. Exposure to STD       MDM  Patient leaving town tomorrow.  Wants treated now.  Culture pending.        Geoffery Lyons, MD 04/25/13 1324

## 2013-04-25 NOTE — ED Notes (Signed)
MD at bedside. 

## 2013-06-08 ENCOUNTER — Emergency Department (HOSPITAL_BASED_OUTPATIENT_CLINIC_OR_DEPARTMENT_OTHER)
Admission: EM | Admit: 2013-06-08 | Discharge: 2013-06-08 | Disposition: A | Discharge status: Home / Self Care | Payer: Self-pay | Attending: Emergency Medicine | Admitting: Emergency Medicine

## 2013-06-08 ENCOUNTER — Encounter (HOSPITAL_BASED_OUTPATIENT_CLINIC_OR_DEPARTMENT_OTHER): Payer: Self-pay | Admitting: *Deleted

## 2013-06-08 DIAGNOSIS — N342 Other urethritis: Secondary | ICD-10-CM | POA: Insufficient documentation

## 2013-06-08 DIAGNOSIS — Z8619 Personal history of other infectious and parasitic diseases: Secondary | ICD-10-CM | POA: Insufficient documentation

## 2013-06-08 DIAGNOSIS — Z8659 Personal history of other mental and behavioral disorders: Secondary | ICD-10-CM | POA: Insufficient documentation

## 2013-06-08 LAB — URINALYSIS, ROUTINE W REFLEX MICROSCOPIC
Bilirubin Urine: NEGATIVE
Glucose, UA: NEGATIVE mg/dL
Hgb urine dipstick: NEGATIVE
Ketones, ur: NEGATIVE mg/dL
Protein, ur: NEGATIVE mg/dL

## 2013-06-08 MED ORDER — CEFTRIAXONE SODIUM 250 MG IJ SOLR
250.0000 mg | Freq: Once | INTRAMUSCULAR | Status: AC
  Administered 2013-06-08: 250 mg via INTRAMUSCULAR
  Filled 2013-06-08 (×2): qty 250

## 2013-06-08 MED ORDER — AZITHROMYCIN 250 MG PO TABS
1000.0000 mg | ORAL_TABLET | Freq: Once | ORAL | Status: AC
  Administered 2013-06-08: 1000 mg via ORAL
  Filled 2013-06-08: qty 4

## 2013-06-08 NOTE — ED Provider Notes (Signed)
Medical screening examination/treatment/procedure(s) were performed by non-physician practitioner and as supervising physician I was immediately available for consultation/collaboration.   Rolan Bucco, MD 06/08/13 [phone]

## 2013-06-08 NOTE — ED Notes (Signed)
States he has a penile discharge.

## 2013-06-08 NOTE — ED Provider Notes (Signed)
  CSN: [id number]     Arrival date & time 06/08/13  1324 History     First MD Initiated Contact with Patient 06/08/13 1430     No chief complaint on file.  (Consider location/radiation/quality/duration/timing/severity/associated sxs/prior Treatment) Patient is a 22 y.o. male presenting with penile discharge. The history is provided by the patient. No language interpreter was used.  Penile Discharge This is a new problem. The current episode started yesterday. The problem occurs constantly. The problem has been unchanged. Nothing aggravates the symptoms. He has tried nothing for the symptoms. The treatment provided no relief.    Past Medical History  Diagnosis Date  . Attention deficit disorder   . STD (male)   . Chlamydia    History reviewed. No pertinent past surgical history. Family History  Problem Relation Age of Onset  . Cancer Other   . Rheum arthritis Other    History  Substance Use Topics  . Smoking status: Never Smoker   . Smokeless tobacco: Never Used  . Alcohol Use: 0.0 oz/week    Review of Systems  Genitourinary: Positive for discharge.  All other systems reviewed and are negative.    Allergies  Review of patient's allergies indicates no known allergies.  Home Medications  No current outpatient prescriptions on file. BP 128/85  Pulse 81  Temp(Src) 97.9 F (36.6 C) (Oral)  Resp 20  Wt 150 lb (68.04 kg)  BMI 19.79 kg/m2  SpO2 100% Physical Exam  Nursing note and vitals reviewed. Constitutional: He appears well-developed and well-nourished.  Eyes: Pupils are equal, round, and reactive to light.  Neck: Normal range of motion. Neck supple.  Cardiovascular: Normal rate.   Pulmonary/Chest: Effort normal and breath sounds normal.  Abdominal: Soft. Bowel sounds are normal.  Genitourinary: Penis normal. No penile tenderness.  Neurological: He is alert.  Skin: There is erythema.    ED Course   Procedures (including critical care time)  Labs Reviewed   GC/CHLAMYDIA PROBE AMP  URINALYSIS, ROUTINE W REFLEX MICROSCOPIC   No results found. 1. Urethritis     MDM  gc and ct pending,   Pt given rocephin and zithromax  Elson Areas, PA-C 06/08/13 1455

## 2013-09-28 ENCOUNTER — Emergency Department (HOSPITAL_BASED_OUTPATIENT_CLINIC_OR_DEPARTMENT_OTHER)
Admission: EM | Admit: 2013-09-28 | Discharge: 2013-09-28 | Disposition: A | Discharge status: Home / Self Care | Payer: Self-pay | Attending: Emergency Medicine | Admitting: Emergency Medicine

## 2013-09-28 ENCOUNTER — Encounter (HOSPITAL_BASED_OUTPATIENT_CLINIC_OR_DEPARTMENT_OTHER): Payer: Self-pay | Admitting: Emergency Medicine

## 2013-09-28 DIAGNOSIS — Z8659 Personal history of other mental and behavioral disorders: Secondary | ICD-10-CM | POA: Insufficient documentation

## 2013-09-28 DIAGNOSIS — Z202 Contact with and (suspected) exposure to infections with a predominantly sexual mode of transmission: Secondary | ICD-10-CM | POA: Insufficient documentation

## 2013-09-28 DIAGNOSIS — R369 Urethral discharge, unspecified: Secondary | ICD-10-CM | POA: Insufficient documentation

## 2013-09-28 DIAGNOSIS — Z8619 Personal history of other infectious and parasitic diseases: Secondary | ICD-10-CM | POA: Insufficient documentation

## 2013-09-28 MED ORDER — CEFTRIAXONE SODIUM 250 MG IJ SOLR
250.0000 mg | INTRAMUSCULAR | Status: DC
  Administered 2013-09-28: 250 mg via INTRAMUSCULAR
  Filled 2013-09-28: qty 250

## 2013-09-28 MED ORDER — LIDOCAINE HCL (PF) 1 % IJ SOLN
INTRAMUSCULAR | Status: AC
  Administered 2013-09-28: 16:00:00
  Filled 2013-09-28: qty 5

## 2013-09-28 MED ORDER — AZITHROMYCIN 250 MG PO TABS
1000.0000 mg | ORAL_TABLET | Freq: Once | ORAL | Status: AC
  Administered 2013-09-28: 1000 mg via ORAL
  Filled 2013-09-28: qty 4

## 2013-09-28 NOTE — ED Provider Notes (Signed)
CSN: [id number]     Arrival date & time 09/28/13  1518 History   First MD Initiated Contact with Patient 09/28/13 1549     Chief Complaint  Patient presents with  . Exposure to STD    HPI  Pt recently traveled to [place] and had a one-time sex partner, On e week ago.  Used a condom.  Got a call from her 2 days ago that she tested + for GC and Chlamydia.  Pt states asymptomatic, but "might" see a "little" dc after urinating.  Desires post exposure prophylactic Tx.  Declines testing.  Past Medical History  Diagnosis Date  . Attention deficit disorder   . STD (male)   . Chlamydia    History reviewed. No pertinent past surgical history. Family History  Problem Relation Age of Onset  . Cancer Other   . Rheum arthritis Other    History  Substance Use Topics  . Smoking status: Never Smoker   . Smokeless tobacco: Never Used  . Alcohol Use: 0.0 oz/week    Review of Systems  Constitutional: Negative for fever, chills, diaphoresis, appetite change and fatigue.  HENT: Negative for mouth sores, sore throat and trouble swallowing.   Eyes: Negative for visual disturbance.  Respiratory: Negative for cough, chest tightness, shortness of breath and wheezing.   Cardiovascular: Negative for chest pain.  Gastrointestinal: Negative for nausea, vomiting, abdominal pain, diarrhea and abdominal distention.  Endocrine: Negative for polydipsia, polyphagia and polyuria.  Genitourinary: Positive for discharge. Negative for dysuria, frequency and hematuria.  Musculoskeletal: Negative for gait problem.  Skin: Negative for color change, pallor and rash.  Neurological: Negative for dizziness, syncope, light-headedness and headaches.  Hematological: Does not bruise/bleed easily.  Psychiatric/Behavioral: Negative for behavioral problems and confusion.    Allergies  Review of patient's allergies indicates no known allergies.  Home Medications  No current outpatient prescriptions on file. BP 125/84  Pulse  72  Temp(Src) 98.5 F (36.9 C) (Oral)  Ht 6\' 1"  (1.854 m)  Wt 160 lb (72.576 kg)  BMI 21.11 kg/m2  SpO2 100% Physical Exam  Genitourinary:  Normal exam.  No lesions.  No adenopathy.    ED Course  Procedures (including critical care time) Labs Review Labs Reviewed - No data to display Imaging Review No results found.  EKG Interpretation   None       MDM   1. Exposure to STD    Always use a condom.    Roney Marion, MD 09/28/13 [phone]

## 2013-09-28 NOTE — ED Notes (Signed)
States was told needed to come and get treatedfor STD

## 2013-10-21 ENCOUNTER — Emergency Department (HOSPITAL_BASED_OUTPATIENT_CLINIC_OR_DEPARTMENT_OTHER)
Admission: EM | Admit: 2013-10-21 | Discharge: 2013-10-21 | Disposition: A | Discharge status: Home / Self Care | Payer: Self-pay | Attending: Emergency Medicine | Admitting: Emergency Medicine

## 2013-10-21 ENCOUNTER — Encounter (HOSPITAL_BASED_OUTPATIENT_CLINIC_OR_DEPARTMENT_OTHER): Payer: Self-pay | Admitting: Emergency Medicine

## 2013-10-21 DIAGNOSIS — Z202 Contact with and (suspected) exposure to infections with a predominantly sexual mode of transmission: Secondary | ICD-10-CM

## 2013-10-21 DIAGNOSIS — Z8619 Personal history of other infectious and parasitic diseases: Secondary | ICD-10-CM | POA: Insufficient documentation

## 2013-10-21 DIAGNOSIS — Z8659 Personal history of other mental and behavioral disorders: Secondary | ICD-10-CM | POA: Insufficient documentation

## 2013-10-21 DIAGNOSIS — R300 Dysuria: Secondary | ICD-10-CM | POA: Insufficient documentation

## 2013-10-21 MED ORDER — LIDOCAINE HCL (PF) 1 % IJ SOLN
INTRAMUSCULAR | Status: AC
  Administered 2013-10-21: 2.1 mL
  Filled 2013-10-21: qty 5

## 2013-10-21 MED ORDER — AZITHROMYCIN 1 G PO PACK
1.0000 g | PACK | Freq: Once | ORAL | Status: AC
  Administered 2013-10-21: 1 g via ORAL
  Filled 2013-10-21: qty 1

## 2013-10-21 MED ORDER — ONDANSETRON 8 MG PO TBDP
8.0000 mg | ORAL_TABLET | Freq: Once | ORAL | Status: AC
  Administered 2013-10-21: 8 mg via ORAL
  Filled 2013-10-21: qty 1

## 2013-10-21 MED ORDER — CEFTRIAXONE SODIUM 250 MG IJ SOLR
INTRAMUSCULAR | Status: AC
  Administered 2013-10-21: 250 mg via INTRAMUSCULAR
  Filled 2013-10-21: qty 250

## 2013-10-21 MED ORDER — CEFTRIAXONE SODIUM 250 MG IJ SOLR
250.0000 mg | Freq: Once | INTRAMUSCULAR | Status: AC
  Administered 2013-10-21: 250 mg via INTRAMUSCULAR
  Filled 2013-10-21: qty 250

## 2013-10-21 NOTE — ED Provider Notes (Signed)
CSN: [id number]     Arrival date & time 10/21/13  1019 History   First MD Initiated Contact with Patient 10/21/13 1121     Chief Complaint  Patient presents with  . Exposure to STD   (Consider location/radiation/quality/duration/timing/severity/associated sxs/prior Treatment) HPI This 22 year old male was treated for exposure to STD within the last month he states the same partner told her she tested again positive or Chlamydia and gonorrhea since that time and that he needs to be treated again so he came to the emergency department for repeat treatment. He has minimal dysuria but no fever no rash no abdominal pain no vomiting no other concerns. He does not want testing for HIV or syphilis.  Past Medical History  Diagnosis Date  . Attention deficit disorder   . STD (male)   . Chlamydia    History reviewed. No pertinent past surgical history. Family History  Problem Relation Age of Onset  . Cancer Other   . Rheum arthritis Other    History  Substance Use Topics  . Smoking status: Never Smoker   . Smokeless tobacco: Never Used  . Alcohol Use: 0.0 oz/week    Review of Systems  See HPI. Allergies  Review of patient's allergies indicates no known allergies.  Home Medications  No current outpatient prescriptions on file. BP 130/84  Pulse 85  Temp(Src) 98.8 F (37.1 C)  Resp 18  SpO2 100% Physical Exam  Nursing note and vitals reviewed. Constitutional:  Awake, alert, nontoxic appearance.  HENT:  Head: Atraumatic.  Eyes: Right eye exhibits no discharge. Left eye exhibits no discharge.  Neck: Neck supple.  Cardiovascular: Normal rate and regular rhythm.   No murmur heard. Pulmonary/Chest: Effort normal and breath sounds normal. No respiratory distress. He has no wheezes. He has no rales. He exhibits no tenderness.  Abdominal: Soft. Bowel sounds are normal. He exhibits no distension. There is no tenderness. There is no rebound and no guarding.  Genitourinary:  No penile  discharge or lesions noted testicles are nontender  Musculoskeletal: He exhibits no tenderness.  Baseline ROM, no obvious new focal weakness.  Neurological:  Mental status and motor strength appears baseline for patient and situation.  Skin: No rash noted.  Psychiatric: He has a normal mood and affect.    ED Course  Procedures (including critical care time) Patient informed of clinical course, understand medical decision-making process, and agree with plan. Labs Review Labs Reviewed - No data to display Imaging Review No results found.  EKG Interpretation   None       MDM   1. Exposure to STD    I doubt any other EMC precluding discharge at this time including, but not necessarily limited to the following:sepsis.    Hurman Horn, MD 10/22/13 2113

## 2013-10-21 NOTE — ED Notes (Signed)
Patient states his partner informed him that she had an std. C/o of burning when urinating

## 2013-11-07 ENCOUNTER — Emergency Department (HOSPITAL_BASED_OUTPATIENT_CLINIC_OR_DEPARTMENT_OTHER)
Admission: EM | Admit: 2013-11-07 | Discharge: 2013-11-07 | Disposition: A | Discharge status: Home / Self Care | Payer: Self-pay | Attending: Emergency Medicine | Admitting: Emergency Medicine

## 2013-11-07 ENCOUNTER — Encounter (HOSPITAL_BASED_OUTPATIENT_CLINIC_OR_DEPARTMENT_OTHER): Payer: Self-pay | Admitting: Emergency Medicine

## 2013-11-07 DIAGNOSIS — N342 Other urethritis: Secondary | ICD-10-CM | POA: Insufficient documentation

## 2013-11-07 DIAGNOSIS — R61 Generalized hyperhidrosis: Secondary | ICD-10-CM | POA: Insufficient documentation

## 2013-11-07 DIAGNOSIS — Z8619 Personal history of other infectious and parasitic diseases: Secondary | ICD-10-CM | POA: Insufficient documentation

## 2013-11-07 DIAGNOSIS — Z8659 Personal history of other mental and behavioral disorders: Secondary | ICD-10-CM | POA: Insufficient documentation

## 2013-11-07 MED ORDER — AZITHROMYCIN 250 MG PO TABS
1000.0000 mg | ORAL_TABLET | Freq: Once | ORAL | Status: AC
  Administered 2013-11-07: 1000 mg via ORAL
  Filled 2013-11-07: qty 4

## 2013-11-07 MED ORDER — CEFTRIAXONE SODIUM 250 MG IJ SOLR
250.0000 mg | Freq: Once | INTRAMUSCULAR | Status: AC
  Administered 2013-11-07: 250 mg via INTRAMUSCULAR
  Filled 2013-11-07: qty 250

## 2013-11-07 NOTE — ED Notes (Signed)
Pt reports needs tx for STD

## 2013-11-07 NOTE — ED Provider Notes (Signed)
CSN: [id number]     Arrival date & time 11/07/13  2002 History  This chart was scribed for Hilario Quarry, MD by Quintella Reichert, ED scribe.  This patient was seen in room MH09/MH09 and the patient's care was started at 9:44 PM.   Chief Complaint  Patient presents with  . Exposure to STD    Patient is a 22 y.o. male presenting with STD exposure. The history is provided by the patient. No language interpreter was used.  Exposure to STD This is a new problem. The problem has been gradually worsening. Associated symptoms include abdominal pain. Associated symptoms comments: Lower abdominal pain, clear penile discharge, and night sweats. He has tried nothing for the symptoms.    HPI Comments: Christian Beltran is a 22 y.o. male who presents to the Emergency Department complaining of STD exposure.  Pt received a call today from a sexual partner who informed him that she has been diagnosed with chlamydia and gonorrhea.  He complains of lower abdominal pain, clear penile discharge, and night sweats.  Pt states he has had chlamydia before with similar associated symptoms.  He denies chronic medical conditions or regular medication usage.  He denies allergies.   Past Medical History  Diagnosis Date  . Attention deficit disorder   . STD (male)   . Chlamydia     History reviewed. No pertinent past surgical history.   Family History  Problem Relation Age of Onset  . Cancer Other   . Rheum arthritis Other     History  Substance Use Topics  . Smoking status: Never Smoker   . Smokeless tobacco: Never Used  . Alcohol Use: 0.0 oz/week     Review of Systems  Constitutional:       Night sweats  Gastrointestinal: Positive for abdominal pain.  Genitourinary: Positive for discharge.  All other systems reviewed and are negative.     Allergies  Review of patient's allergies indicates no known allergies.  Home Medications  No current outpatient prescriptions on file.  BP 130/81   Pulse 78  Temp(Src) 98.2 F (36.8 C) (Oral)  Resp 16  Ht 6\' 1"  (1.854 m)  Wt 150 lb (68.04 kg)  BMI 19.79 kg/m2  SpO2 100%  Physical Exam  Nursing note and vitals reviewed. Constitutional: He is oriented to person, place, and time. He appears well-developed and well-nourished. No distress.  HENT:  Head: Normocephalic and atraumatic.  Eyes: EOM are normal.  Neck: Neck supple. No tracheal deviation present.  Cardiovascular: Normal rate.   Pulmonary/Chest: Effort normal. No respiratory distress.  Genitourinary: Testes normal and penis normal. No discharge found.  Musculoskeletal: Normal range of motion.  Neurological: He is alert and oriented to person, place, and time.  Skin: Skin is warm and dry.  Psychiatric: He has a normal mood and affect. His behavior is normal.    ED Course  Procedures (including critical care time)  DIAGNOSTIC STUDIES: Oxygen Saturation is 100% on room air, normal by my interpretation.    COORDINATION OF CARE: 9:47 PM-Discussed treatment plan which includes GC/Chlamydia probe, Rocephin injection, and Zithromax tablets with pt at bedside and pt agreed to plan.    Labs Review Labs Reviewed - No data to display  Imaging Review No results found.  EKG Interpretation   None       MDM  I personally performed the services described in this documentation, which was scribed in my presence. The recorded information has been reviewed and considered.    Duwayne Heck  Denny Levy, MD 11/07/13 [phone]

## 2013-11-08 LAB — GC/CHLAMYDIA PROBE AMP
CT Probe RNA: NEGATIVE
GC Probe RNA: NEGATIVE

## 2014-07-17 ENCOUNTER — Encounter (HOSPITAL_BASED_OUTPATIENT_CLINIC_OR_DEPARTMENT_OTHER): Payer: Self-pay | Admitting: Emergency Medicine

## 2014-07-17 ENCOUNTER — Emergency Department (HOSPITAL_BASED_OUTPATIENT_CLINIC_OR_DEPARTMENT_OTHER)
Admission: EM | Admit: 2014-07-17 | Discharge: 2014-07-17 | Disposition: A | Discharge status: Home / Self Care | Payer: Self-pay | Attending: Emergency Medicine | Admitting: Emergency Medicine

## 2014-07-17 DIAGNOSIS — R21 Rash and other nonspecific skin eruption: Secondary | ICD-10-CM | POA: Insufficient documentation

## 2014-07-17 DIAGNOSIS — Z8619 Personal history of other infectious and parasitic diseases: Secondary | ICD-10-CM | POA: Insufficient documentation

## 2014-07-17 DIAGNOSIS — B86 Scabies: Secondary | ICD-10-CM | POA: Insufficient documentation

## 2014-07-17 DIAGNOSIS — Z8659 Personal history of other mental and behavioral disorders: Secondary | ICD-10-CM | POA: Insufficient documentation

## 2014-07-17 MED ORDER — PERMETHRIN 5 % EX CREA: TOPICAL_CREAM | CUTANEOUS | Status: DC

## 2014-07-17 NOTE — Discharge Instructions (Signed)

## 2014-07-17 NOTE — ED Notes (Signed)
C/o scattered rash x 2-3 weeks-pt thinks it is scabies/hx of same

## 2014-07-17 NOTE — ED Provider Notes (Signed)
Medical screening examination/treatment/procedure(s) were performed by non-physician practitioner and as supervising physician I was immediately available for consultation/collaboration.    Nelia Shi, MD 07/17/14 1515

## 2014-07-17 NOTE — ED Provider Notes (Signed)
CSN: [id number]     Arrival date & time 07/17/14  1419 History   First MD Initiated Contact with Patient 07/17/14 1428     Chief Complaint  Patient presents with  . Rash     (Consider location/radiation/quality/duration/timing/severity/associated sxs/prior Treatment) HPI Comments: Pt states that he developed a rash 3 weeks ago. Pt states that the he has a history of scabies and it feels the same. Pt states that on his fingers toes and groin. No fever or neck pain  The history is provided by the patient. No language interpreter was used.    Past Medical History  Diagnosis Date  . Attention deficit disorder   . STD (male)   . Chlamydia    History reviewed. No pertinent past surgical history. Family History  Problem Relation Age of Onset  . Cancer Other   . Rheum arthritis Other    History  Substance Use Topics  . Smoking status: Never Smoker   . Smokeless tobacco: Never Used  . Alcohol Use: 0.0 oz/week    Review of Systems  Constitutional: Negative.   Respiratory: Negative.   Cardiovascular: Negative.       Allergies  Review of patient's allergies indicates no known allergies.  Home Medications   Prior to Admission medications   Medication Sig Start Date End Date Taking? Authorizing Provider  permethrin (ELIMITE) 5 % cream Apply to affected area once 07/17/14   Teressa Lower, NP   BP 139/89  Pulse 81  Temp(Src) 99.6 F (37.6 C) (Oral)  Resp 16  Ht  (1.88 m)  Wt 162 lb (73.483 kg)  BMI 20.79 kg/m2  SpO2 100% Physical Exam  Nursing note and vitals reviewed. Constitutional: He is oriented to person, place, and time. He appears well-developed and well-nourished.  Cardiovascular: Normal rate and regular rhythm.   Pulmonary/Chest: Effort normal and breath sounds normal.  Musculoskeletal: Normal range of motion.  Neurological: He is alert and oriented to person, place, and time.  Skin:  Papular rash to webs of fingers toes, waist line and goin    ED  Course  Procedures (including critical care time) Labs Review Labs Reviewed - No data to display  Imaging Review No results found.   EKG Interpretation None      MDM   Final diagnoses:  Scabies    Consistent with scabies. Will treat with elemite    Teressa Lower, NP 07/17/14 1447

## 2014-10-10 ENCOUNTER — Emergency Department (HOSPITAL_BASED_OUTPATIENT_CLINIC_OR_DEPARTMENT_OTHER)
Admission: EM | Admit: 2014-10-10 | Discharge: 2014-10-11 | Disposition: A | Discharge status: Home / Self Care | Payer: Self-pay | Attending: Emergency Medicine | Admitting: Emergency Medicine

## 2014-10-10 ENCOUNTER — Encounter (HOSPITAL_BASED_OUTPATIENT_CLINIC_OR_DEPARTMENT_OTHER): Payer: Self-pay | Admitting: *Deleted

## 2014-10-10 DIAGNOSIS — Z792 Long term (current) use of antibiotics: Secondary | ICD-10-CM | POA: Insufficient documentation

## 2014-10-10 DIAGNOSIS — Z720 Tobacco use: Secondary | ICD-10-CM | POA: Insufficient documentation

## 2014-10-10 DIAGNOSIS — R109 Unspecified abdominal pain: Secondary | ICD-10-CM | POA: Insufficient documentation

## 2014-10-10 DIAGNOSIS — Z8659 Personal history of other mental and behavioral disorders: Secondary | ICD-10-CM | POA: Insufficient documentation

## 2014-10-10 DIAGNOSIS — A64 Unspecified sexually transmitted disease: Secondary | ICD-10-CM | POA: Insufficient documentation

## 2014-10-10 LAB — URINE MICROSCOPIC-ADD ON

## 2014-10-10 LAB — URINALYSIS, ROUTINE W REFLEX MICROSCOPIC
BILIRUBIN URINE: NEGATIVE
GLUCOSE, UA: NEGATIVE mg/dL
HGB URINE DIPSTICK: NEGATIVE
Ketones, ur: NEGATIVE mg/dL
Leukocytes, UA: NEGATIVE
Nitrite: NEGATIVE
PROTEIN: 30 mg/dL — AB
Specific Gravity, Urine: 1.024 (ref 1.005–1.030)
UROBILINOGEN UA: 0.2 mg/dL (ref 0.0–1.0)
pH: 6 (ref 5.0–8.0)

## 2014-10-10 LAB — RAPID HIV SCREEN (WH-MAU): SUDS RAPID HIV SCREEN: NONREACTIVE

## 2014-10-10 MED ORDER — LIDOCAINE HCL (PF) 1 % IJ SOLN
INTRAMUSCULAR | Status: AC
  Filled 2014-10-10: qty 5

## 2014-10-10 MED ORDER — METRONIDAZOLE 500 MG PO TABS
2000.0000 mg | ORAL_TABLET | Freq: Once | ORAL | Status: AC
  Administered 2014-10-10: 2000 mg via ORAL

## 2014-10-10 MED ORDER — CEFTRIAXONE SODIUM 250 MG IJ SOLR
250.0000 mg | Freq: Once | INTRAMUSCULAR | Status: AC
  Administered 2014-10-10: 250 mg via INTRAMUSCULAR

## 2014-10-10 MED ORDER — METRONIDAZOLE 500 MG PO TABS
ORAL_TABLET | ORAL | Status: AC
  Filled 2014-10-10: qty 4

## 2014-10-10 MED ORDER — CEFTRIAXONE SODIUM 1 G IJ SOLR
INTRAMUSCULAR | Status: AC
  Filled 2014-10-10: qty 10

## 2014-10-10 MED ORDER — DOXYCYCLINE HYCLATE 100 MG PO TABS
ORAL_TABLET | ORAL | Status: AC
  Filled 2014-10-10: qty 1

## 2014-10-10 MED ORDER — DOXYCYCLINE HYCLATE 100 MG PO CAPS: 100.0000 mg | ORAL_CAPSULE | Freq: Two times a day (BID) | ORAL | Status: DC

## 2014-10-10 MED ORDER — DOXYCYCLINE HYCLATE 100 MG PO TABS
100.0000 mg | ORAL_TABLET | Freq: Once | ORAL | Status: AC
  Administered 2014-10-10: 100 mg via ORAL

## 2014-10-10 NOTE — ED Provider Notes (Signed)
CSN: [id number]     Arrival date & time 10/10/14  2127 History  This chart was scribed for Arby BarretteMarcy Khira Cudmore, MD by Evon Slackerrance Branch, ED Scribe. This patient was seen in room MH05/MH05 and the patient's care was started at 10:41 PM.    Chief Complaint  Patient presents with  . Exposure to STD   The history is provided by the patient. No language interpreter was used.   HPI Comments: Christian Beltran is a 23 y.o. male who presents to the Emergency Department complaining of STD exposure. He states that he had abdominal pain and penile discharge. He states that he was told by his sexual partner that they had chlamydia and would like to receive treatment. Denies fever, chills or dysuria.   Past Medical History  Diagnosis Date  . Attention deficit disorder   . STD (male)   . Chlamydia    History reviewed. No pertinent past surgical history. Family History  Problem Relation Age of Onset  . Cancer Other   . Rheum arthritis Other    History  Substance Use Topics  . Smoking status: Current Every Day Smoker  . Smokeless tobacco: Never Used  . Alcohol Use: 0.0 oz/week    Review of Systems  Constitutional: Negative for fever and chills.  Gastrointestinal: Positive for abdominal pain.  Genitourinary: Positive for discharge. Negative for dysuria.    Allergies  Review of patient's allergies indicates no known allergies.  Home Medications   Prior to Admission medications   Medication Sig Start Date End Date Taking? Authorizing Provider  doxycycline (VIBRAMYCIN) 100 MG capsule Take 1 capsule (100 mg total) by mouth 2 (two) times daily. One po bid x 7 days 10/10/14   Arby BarretteMarcy Jaklyn Alen, MD  permethrin (ELIMITE) 5 % cream Apply to affected area once 07/17/14   Teressa LowerVrinda Pickering, NP   Triage Vitals: BP 149/95 mmHg  Pulse 83  Temp(Src) 98.6 F (37 C)  Resp 18  Ht 6\' 1"  (1.854 m)  Wt 145 lb (65.772 kg)  BMI 19.13 kg/m2  SpO2 98%  Physical Exam  Constitutional: He is oriented to person, place,  and time. He appears well-developed and well-nourished. No distress.  HENT:  Head: Normocephalic and atraumatic.  Eyes: EOM are normal.  Pulmonary/Chest: Effort normal.  Abdominal: Soft. Bowel sounds are normal. He exhibits no distension and no mass. There is no tenderness. There is no rebound and no guarding.  Musculoskeletal: Normal range of motion. He exhibits no edema.  Neurological: He is alert and oriented to person, place, and time. Coordination normal.  Skin: Skin is warm and dry.  Psychiatric: He has a normal mood and affect.    ED Course  Procedures (including critical care time) DIAGNOSTIC STUDIES: Oxygen Saturation is 98% on RA, normal by my interpretation.    COORDINATION OF CARE: 10:44 PM-Discussed treatment plan with pt at bedside and pt agreed to plan.     Labs Review Labs Reviewed  URINALYSIS, ROUTINE W REFLEX MICROSCOPIC - Abnormal; Notable for the following:    Protein, ur 30 (*)    All other components within normal limits  RAPID HIV SCREEN ([facility])  URINE MICROSCOPIC-ADD ON    Imaging Review No results found.   EKG Interpretation None      MDM   Final diagnoses:  STD (male)    Patient reports his known exposure to chlamydia. He presents for STD treatment. He is nontoxic alert otherwise well appearance. HIV testing is obtained and patient is treated empirically for STD with  Rocephin, doxycycline and Flagyl.  I personally performed the services described in this documentation, which was scribed in my presence. The recorded information has been reviewed and is accurate.  Arby BarretteMarcy Imre Vecchione, MD 10/11/14 [id number]

## 2014-10-10 NOTE — Discharge Instructions (Signed)
Sexually Transmitted Disease °A sexually transmitted disease (STD) is a disease or infection often passed to another person during sex. However, STDs can be passed through nonsexual ways. An STD can be passed through: °· Spit (saliva). °· Semen. °· Blood. °· Mucus from the vagina. °· Pee (urine). °HOW CAN I LESSEN MY CHANCES OF GETTING AN STD? °· Use: °¨ Latex condoms. °¨ Water-soluble lubricants with condoms. Do not use petroleum jelly or oils. °¨ Dental dams. These are small pieces of latex that are used as a barrier during oral sex. °· Avoid having more than one sex partner. °· Do not have sex with someone who has other sex partners. °· Do not have sex with anyone you do not know or who is at high risk for an STD. °· Avoid risky sex that can break your skin. °· Do not have sex if you have open sores on your mouth or skin. °· Avoid drinking too much alcohol or taking illegal drugs. Alcohol and drugs can affect your good judgment. °· Avoid oral and anal sex acts. °· Get shots (vaccines) for HPV and hepatitis. °· If you are at risk of being infected with HIV, it is advised that you take a certain medicine daily to prevent HIV infection. This is called pre-exposure prophylaxis (PrEP). You may be at risk if: °¨ You are a man who has sex with other men (MSM). °¨ You are attracted to the opposite sex (heterosexual) and are having sex with more than one partner. °¨ You take drugs with a needle. °¨ You have sex with someone who has HIV. °· Talk with your doctor about if you are at high risk of being infected with HIV. If you begin to take PrEP, get tested for HIV first. Get tested every 3 months for as long as you are taking PrEP. °WHAT SHOULD I DO IF I THINK I HAVE AN STD? °· See your doctor. °· Tell your sex partner(s) that you have an STD. They should be tested and treated. °· Do not have sex until your doctor says it is okay. °WHEN SHOULD I GET HELP? °Get help right away if: °· You have bad belly (abdominal)  pain. °· You are a man and have puffiness (swelling) or pain in your testicles. °· You are a woman and have puffiness in your vagina. °Document Released: 12/02/2004 Document Revised: 10/30/2013 Document Reviewed: 04/20/2013 °ExitCare® Patient Information ©2015 [employer]. This information is not intended to replace advice given to you by your health care provider. Make sure you discuss any questions you have with your health care provider. ° ° °Emergency Department Resource Guide °1) Find a Doctor and Pay Out of Pocket °Although you won't have to find out who is covered by your insurance plan, it is a good idea to ask around and get recommendations. You will then need to call the office and see if the doctor you have chosen will accept you as a new patient and what types of options they offer for patients who are self-pay. Some doctors offer discounts or will set up payment plans for their patients who do not have insurance, but you will need to ask so you aren't surprised when you get to your appointment. ° °2) Contact Your Local Health Department °Not all health departments have doctors that can see patients for sick visits, but many do, so it is worth a call to see if yours does. If you don't know where your local health department is, you can   check in your phone book. The CDC also has a tool to help you locate your state's health department, and many state websites also have listings of all of their local health departments. ° °3) Find a Walk-in Clinic °If your illness is not likely to be very severe or complicated, you may want to try a walk in clinic. These are popping up all over the country in pharmacies, drugstores, and shopping centers. They're usually staffed by nurse practitioners or physician assistants that have been trained to treat common illnesses and complaints. They're usually fairly quick and inexpensive. However, if you have serious medical issues or chronic medical problems, these are  probably not your best option. ° °No Primary Care Doctor: °- Call [facility] at  832-8000 - they can help you locate a primary care doctor that  accepts your insurance, provides certain services, etc. °- Physician Referral Service- 1-800-533-3463 ° °Chronic Pain Problems: °Organization         Address  Phone   Notes  ° [facility]  (336) 297-2271 Patients need to be referred by their primary care doctor.  ° °Medication Assistance: °Organization         Address  Phone   Notes  °[place] [facility] [address] °[place], [place] [address] (336) 641-8030 --Must be a resident of [place] °-- Must have NO insurance coverage whatsoever (no Medicaid/ Medicare, etc.) °-- The pt. MUST have a primary care doctor that directs their care regularly and follows them in the community °  °[facility]  (866) 331-1348   °[employer]  (888) 892-1162   ° °Agencies that provide inexpensive medical care: °Organization         Address  Phone   Notes  °[facility]  (336) 832-8035   °[facility]    (336) 832-7272   °[facility] [address] °[place], [place] [address] (336) 832-4777   °[facility] [address], °[place] (336) 271-4999   °[facility]    (336) 373-0678   °[facility]    (336) 272-1050   °[facility] ° [address], [place] Phone:  (336) 832-4444, Fax:  (336) 832-4440 Hours of Operation:  9 am - 6 pm, M-F.  Also accepts Medicaid/Medicare and self-pay.  °[facility] ° [address], [place] Phone: (336) 832-3150, Fax: (336) 832-3151. Hours of Operation:  8:30 am - 5:30 pm, M-F.  Also accepts Medicaid and self-pay.  °[facility] [place] [address], [place] Phone: (336) 878-6027   °[facility] [address], [place],  (336)723-1848, Ext. 123 Mondays & Thursdays:  7-9 AM.  First 15 patients are seen on a first come, first serve basis. °  ° °Medicaid-accepting [place] Providers: ° °Organization         Address  Phone   Notes  °[facility] [address], [place] (336) 641-2100 Also accepts self-pay patients.  °[facility] [address], [place] ° (336) 856-9996   °[facility] [address], [place] (336) 288-8857   °[facility] [address] [place] [address], [place] (336) 299-7000   °Veita Bland [address], [place]  ° (336) 373-1557 Only accepts [place] Access Medicaid patients after they have their name applied to   their card.  ° °Self-Pay (no insurance) in [place]: ° °Organization         Address  Phone   Notes  °Sickle Cell Patients, [facility] [address], [place] (336) 832-1970   °[facility] [address], [place] (336) 832-4400   °[facility] ° [address], [place] (336) 992-4800   °[facility]/Dr. Osei-Bonsu ° [address] [place] [address], [place] or [address], [place] (336) 841-8500 Phone number for both [place] and [place] locations is the same.  °[facility] [address], [place] (336) 299-0000   °[facility] [address] [place] [address], [place] or [address] (336) 852-7530 °(336) 878-2260   °[facility] [address], [place] (336) 350-1642, phone; (336) 294-5005, fax Sees patients 1st and 3rd Saturday of every month.  Must not qualify for public or private insurance (i.e. Medicaid, Medicare, [place] Health Choice, Veterans' Benefits) • Household income should be no more than 200% of the poverty level •The clinic cannot treat you if you are pregnant or think you are pregnant • Sexually transmitted diseases are not treated at the clinic.   ° ° °Dental Care: °Organization         Address  Phone  Notes  °[facility] [address], [place] (336) 641-6152 Accepts children up to age 21 who are enrolled in Medicaid or [place] Health Choice; pregnant women with a Medicaid card; and children who have applied for Medicaid or [place] Health Choice, but were declined, whose parents can pay a reduced fee at time of service.  °[facility] [place]  [address], [place] (336) 641-7733 Accepts children up to age 21 who are enrolled in Medicaid or [place] Health Choice; pregnant women with a Medicaid card; and children who have applied for Medicaid or [place] Health Choice, but were declined, whose parents can pay a reduced fee at time of service.  °[facility] ° [address], [place] (336) 641-4533 Patients are seen by appointment only. Walk-ins are not accepted. [facility] will see patients 18 years of age and older. °Monday - Tuesday (8am-5pm) °Most Wednesdays (8:30-5pm) °$30 per visit, cash only  °[facility] ° [address], [place] (336) 641-4533 Patients are seen by appointment only. Walk-ins are not accepted. [facility] will see patients 18 years of age and older. °One Wednesday Evening (Monthly: Volunteer Based).  $30 per visit, cash only  °[facility]  (919) 537-3737 for adults; Children under age 4, call Graduate Pediatric Dentistry at (919) [phone]. Children aged 4-14, please call (919) [phone] to request a pediatric application. ° Dental services are provided in all areas of dental care including fillings, crowns and bridges, complete and partial dentures, implants, gum treatment, root canals, and extractions. Preventive care is also provided. Treatment is provided to both adults and children. °Patients are selected via a lottery and there is often a waiting  list. °  °[facility] [address], °[place] ° (336) 763-8833 [email or web address] °  °[facility] [address], [place], [place] (336)723-1848, Ext. 123 Second and Fourth Thursday of each month, opens at 6:30 AM; Clinic ends at 9 AM.  Patients are seen on a first-come first-served basis,   and a limited number are seen during each clinic.  ° °[facility] ° [address], [place], [place] (336) 723-7904   Eligibility Requirements °You must have lived in [place], [place], or [place] counties for at least the last three months. °  You cannot be eligible for state or federal sponsored healthcare insurance, including Veterans Administration, Medicaid, or Medicare. °  You generally cannot be eligible for healthcare insurance through your employer.  °  How to apply: °Eligibility screenings are held every Tuesday and Wednesday afternoon from 1:00 pm until 4:00 pm. You do not need an appointment for the interview!  °[facility] [address], [place], [place] 336-631-2330   °[facility] [facility]  336-342-8273   °[facility]  336-703-3100   ° [facility]  336-570-6415   ° °Behavioral Health Resources in the Community: °Intensive Outpatient Programs °Organization         Address  Phone  Notes  °[facility] [address], [place], [place] 336-878-6098   °[facility] [address], [place], [place] 336-832-9800   °[facility] [address], [place], [place] ° 336-882-2125   °[facility] [address],  °[place], [place] 1-800-853-5163 or 336-641-4981   °Substance Abuse Resources °Organization         Address  Phone  Notes  °[facility]  336-882-2125   °[facility]  336-784-9470   °[facility]  336-285-9073   °[facility]  336-845-3988   °Residential & Outpatient Substance Abuse Program   1-800-659-3381   °Psychological Services °Organization         Address  Phone  Notes  °[facility]  336- 832-9600   °[facility]  336- 378-7881   °[facility] [address], [place] 1-800-853-5163 or 336-641-4981   ° °Mobile Crisis Teams °Organization         Address  Phone  Notes  °[facility], Mobile Crisis Care Unit  1-877-626-1772   °Assertive °[facility] ° [address] [place], [place] 336-834-9664   °Sharon DeEsch [address] °[place] 336-554-5454   ° °Self-Help/Support Groups °Organization         Address  Phone             Notes  °[facility] - variety of support groups  336- 373-1402 Call for more information  °Narcotics Anonymous (NA), [facility] [address], °[place]  2 meetings at this location  ° °Residential Treatment Programs °Organization         Address  Phone  Notes  °[facility] [address],    °[place]  1-866-801-8205   °[facility] ° [address], [place], [place] 704-293-8524   °[facility] [address], [place] 336-845-3988 Admissions: 8am-3pm M-F  °[facility] [address]    °[place], [place] 336-841-1104   °[facility] [address], [place], [place] 336-379-7146   °[facility] [address]  °[place], [place] 336-285-9073   °[facility] - Intensive Outpatient [address], [place], [place] 336-852-3033   °[facility] [address]  °[place], [place] 1-877-615-2722 or 336-784-9470   °[facility] [address] [place], [place] 336-227-7417 Accepts Medicaid  °[facility] [address]  °[place] 1-800-659-3381 Substance Abuse/Addiction Treatment  ° °[place] Behavioral Health Resources °Organization          Address  Phone  Notes  °[facility]  (888) 581-9988   °Julie Brannon, PhD [address] [place], [place]   (336) 349-5553 or (336) 951-0000   °[facility]   [address] °[place], [place] (336) 349-4454   °[facility] [address], [place], [place] (336) 342-8316 Insurance/Medicaid/sponsorship through [facility]  °[facility] [address]                                    [place], [place] (336) 342-8316 Therapy/tele-psych/case  °[facility] [address]  ° [place], [place] (336) 349-2233    °Dr. Arfeen  (336) 349-4544   °[facility] [place]  [employer] [place] [facility] 1) [address], [place] °2) [address], [place] °3)  [address], [place] (336) 349-3220 °(336) 342-7768 ° °(336) 342-8140   °[place] [facility] (336) 342-1394 or (336) 342-3537 (After Hours)    ° ° ° °

## 2014-10-10 NOTE — ED Notes (Signed)
Pt reports he was told by a sexual partner that they have chlamydia, pt here for treatment for the same.

## 2014-10-10 NOTE — ED Notes (Signed)
C/o penile discharge x 2 days, partner being tx for chlamydia

## 2015-08-02 ENCOUNTER — Encounter (HOSPITAL_BASED_OUTPATIENT_CLINIC_OR_DEPARTMENT_OTHER): Payer: Self-pay | Admitting: Emergency Medicine

## 2015-08-02 ENCOUNTER — Emergency Department (HOSPITAL_BASED_OUTPATIENT_CLINIC_OR_DEPARTMENT_OTHER)
Admission: EM | Admit: 2015-08-02 | Discharge: 2015-08-02 | Disposition: A | Discharge status: Home / Self Care | Payer: Self-pay | Attending: Emergency Medicine | Admitting: Emergency Medicine

## 2015-08-02 ENCOUNTER — Emergency Department (HOSPITAL_BASED_OUTPATIENT_CLINIC_OR_DEPARTMENT_OTHER): Payer: Self-pay

## 2015-08-02 DIAGNOSIS — T50905A Adverse effect of unspecified drugs, medicaments and biological substances, initial encounter: Secondary | ICD-10-CM

## 2015-08-02 DIAGNOSIS — Z720 Tobacco use: Secondary | ICD-10-CM | POA: Insufficient documentation

## 2015-08-02 DIAGNOSIS — F1610 Hallucinogen abuse, uncomplicated: Secondary | ICD-10-CM | POA: Insufficient documentation

## 2015-08-02 DIAGNOSIS — T50995A Adverse effect of other drugs, medicaments and biological substances, initial encounter: Secondary | ICD-10-CM | POA: Insufficient documentation

## 2015-08-02 DIAGNOSIS — Z8619 Personal history of other infectious and parasitic diseases: Secondary | ICD-10-CM | POA: Insufficient documentation

## 2015-08-02 LAB — TROPONIN I: Troponin I: <0.03 ng/mL (ref <0.031)

## 2015-08-02 NOTE — ED Provider Notes (Signed)
CSN: [id number]     Arrival date & time 08/02/15  0004 History   First MD Initiated Contact with Patient 08/02/15 0204     Chief Complaint  Patient presents with  . Chest Pain     (Consider location/radiation/quality/duration/timing/severity/associated sxs/prior Treatment) HPI  This is a 24 year old male who developed chest pain about 2 hours ago. This occurred about 1 hour after drinking the beer containing MDMA. He has never had MDMA before. He also admits to smoking marijuana.  He describes the chest pain as localized to his left upper chest. He also describes it as a tightness and a sensation that his heart was beating too rapidly. He was noted to be tachycardic on arrival. He denies shortness of breath or diaphoresis. His symptoms have improved since arrival and his tachycardia has improved.  Past Medical History  Diagnosis Date  . Attention deficit disorder   . STD (male)   . Chlamydia    History reviewed. No pertinent past surgical history. Family History  Problem Relation Age of Onset  . Cancer Other   . Rheum arthritis Other    Social History  Substance Use Topics  . Smoking status: Current Every Day Smoker  . Smokeless tobacco: Never Used  . Alcohol Use: 0.0 oz/week    Review of Systems  All other systems reviewed and are negative.   Allergies  Review of patient's allergies indicates no known allergies.  Home Medications   Prior to Admission medications   Medication Sig Start Date End Date Taking? Authorizing Provider  doxycycline (VIBRAMYCIN) 100 MG capsule Take 1 capsule (100 mg total) by mouth 2 (two) times daily. One po bid x 7 days 10/10/14   Arby Barrette, MD  permethrin (ELIMITE) 5 % cream Apply to affected area once 07/17/14   Teressa Lower, NP   BP 134/76 mmHg  Pulse 108  Temp(Src) 98.5 F (36.9 C)  Resp 16  Ht  (1.88 m)  Wt 152 lb (68.947 kg)  BMI 19.51 kg/m2  SpO2 100%   Physical Exam  General: Well-developed, well-nourished male  in no acute distress; appearance consistent with age of record HENT: normocephalic; atraumatic Eyes: pupils equal, round and reactive to light; extraocular muscles intact Neck: supple Heart: regular rate and rhythm; no murmurs, rubs or gallops Lungs: clear to auscultation bilaterally Abdomen: soft; nondistended; nontender; no masses or hepatosplenomegaly; bowel sounds present Extremities: No deformity; full range of motion; pulses normal Neurologic: Awake, alert and oriented; motor function intact in all extremities and symmetric; no facial droop Skin: Warm and dry Psychiatric: Normal mood and affect    ED Course  Procedures (including critical care time)    EKG Interpretation   Date/Time:  Saturday August 02 2015 00:16:56 EDT Ventricular Rate:  102 PR Interval:  154 QRS Duration: 90 QT Interval:  350 QTC Calculation: 456 R Axis:   111 Text Interpretation:  Sinus tachycardia Biatrial enlargement Right axis  deviation Pulmonary disease pattern Abnormal ECG Rate is faster Confirmed  by MOLPUS  MD, Jonny Ruiz ([id number]) on 08/02/2015 1:57:06 AM      MDM   Nursing notes and vitals signs, including pulse oximetry, reviewed.  Summary of this visit's results, reviewed by myself:  Labs:  Results for orders placed or performed during the hospital encounter of 08/02/15 (from the past 24 hour(s))  Troponin I     Status: None   Collection Time: 08/02/15  1:17 AM  Result Value Ref Range   Troponin I <0.03 <0.031 ng/mL  Imaging Studies: Dg Chest 2 View  08/02/2015   CLINICAL DATA:  Chest pain and palpitations after using drugs and alcohol today.  EXAM: CHEST  2 VIEW  COMPARISON:  03/31/2012  FINDINGS: Mild hyperinflation. Normal heart size and pulmonary vascularity. No focal airspace disease or consolidation in the lungs. No blunting of costophrenic angles. No pneumothorax. Mediastinal contours appear intact.  IMPRESSION: No active cardiopulmonary disease.   Electronically Signed   By:  Burman Nieves M.D.   On: 08/02/2015 01:47   3:06 AM Symptoms continue to improve. He is normal sinus rhythm on the monitor. He was advised to avoid MDMA in the future.  Paula Libra, MD 08/02/15 [phone]

## 2015-08-02 NOTE — ED Notes (Addendum)
Patient reports that he took "molly" earlier this evening and started to have palpitations about an hour ago. He also smoked some pot and drank ETOH. The patient reports that he is having several chest pain and palpitations. Patient reports that he is unsure of the "pureness" of the drugs"

## 2015-08-13 ENCOUNTER — Emergency Department (HOSPITAL_BASED_OUTPATIENT_CLINIC_OR_DEPARTMENT_OTHER)
Admission: EM | Admit: 2015-08-13 | Discharge: 2015-08-13 | Disposition: A | Discharge status: Home / Self Care | Payer: Self-pay | Attending: Emergency Medicine | Admitting: Emergency Medicine

## 2015-08-13 ENCOUNTER — Encounter (HOSPITAL_BASED_OUTPATIENT_CLINIC_OR_DEPARTMENT_OTHER): Payer: Self-pay

## 2015-08-13 DIAGNOSIS — Z8659 Personal history of other mental and behavioral disorders: Secondary | ICD-10-CM | POA: Insufficient documentation

## 2015-08-13 DIAGNOSIS — Z8619 Personal history of other infectious and parasitic diseases: Secondary | ICD-10-CM | POA: Insufficient documentation

## 2015-08-13 DIAGNOSIS — N342 Other urethritis: Secondary | ICD-10-CM | POA: Insufficient documentation

## 2015-08-13 DIAGNOSIS — Z720 Tobacco use: Secondary | ICD-10-CM | POA: Insufficient documentation

## 2015-08-13 MED ORDER — CEFTRIAXONE SODIUM 250 MG IJ SOLR
250.0000 mg | Freq: Once | INTRAMUSCULAR | Status: AC
  Administered 2015-08-13: 250 mg via INTRAMUSCULAR
  Filled 2015-08-13: qty 250

## 2015-08-13 MED ORDER — METRONIDAZOLE 500 MG PO TABS
2000.0000 mg | ORAL_TABLET | Freq: Once | ORAL | Status: AC
  Administered 2015-08-13: 2000 mg via ORAL
  Filled 2015-08-13: qty 4

## 2015-08-13 MED ORDER — AZITHROMYCIN 250 MG PO TABS
1000.0000 mg | ORAL_TABLET | Freq: Once | ORAL | Status: AC
  Administered 2015-08-13: 1000 mg via ORAL
  Filled 2015-08-13: qty 4

## 2015-08-13 NOTE — ED Notes (Signed)
Pt states he was seen at "a clinic in [place]" last week-was advised he was positive for GC/Ch but they could not treat him b/c he could not pay

## 2015-08-13 NOTE — Discharge Instructions (Signed)
Notify any recent partners of the infection so they may seek treatment. Use condoms to avoid sexual transmitted diseases.   Urethritis, Adult Urethritis is an inflammation of the tube through which urine exits your bladder (urethra).  CAUSES Urethritis is often caused by an infection in your urethra. The infection can be viral, like herpes. The infection can also be bacterial, like gonorrhea. RISK FACTORS Risk factors of urethritis include:  Having sex without using a condom.  Having multiple sexual partners.  Having poor hygiene. SIGNS AND SYMPTOMS Symptoms of urethritis are less noticeable in women than in men. These symptoms include:  Burning feeling when you urinate (dysuria).  Discharge from your urethra.  Blood in your urine (hematuria).  Urinating more than usual. DIAGNOSIS  To confirm a diagnosis of urethritis, your health care provider will do the following:  Ask about your sexual history.  Perform a physical exam.  Have you provide a sample of your urine for lab testing.  Use a cotton swab to gently collect a sample from your urethra for lab testing. TREATMENT  It is important to treat urethritis. Depending on the cause, untreated urethritis may lead to serious genital infections and possibly infertility. Urethritis caused by a bacterial infection is treated with antibiotic medicine. All sexual partners must be treated.  HOME CARE INSTRUCTIONS  Do not have sex until the test results are known and treatment is completed, even if your symptoms go away before you finish treatment.  If you were prescribed an antibiotic, finish it all even if you start to feel better. SEEK MEDICAL CARE IF:   Your symptoms are not improved in 3 days.  Your symptoms are getting worse.  You develop abdominal pain or pelvic pain (in women).  You develop joint pain.  You have a fever. SEEK IMMEDIATE MEDICAL CARE IF:   You have severe pain in the belly, back, or side.  You  have repeated vomiting. MAKE SURE YOU:  Understand these instructions.  Will watch your condition.  Will get help right away if you are not doing well or get worse.   This information is not intended to replace advice given to you by your health care provider. Make sure you discuss any questions you have with your health care provider.   Document Released: 04/20/2001 Document Revised: 03/11/2015 Document Reviewed: 06/25/2013 Elsevier Interactive Patient Education [employer]

## 2015-08-13 NOTE — ED Provider Notes (Signed)
CSN: [id number]     Arrival date & time 08/13/15  1218 History   First MD Initiated Contact with Patient 08/13/15 1240     Chief Complaint  Patient presents with  . SEXUALLY TRANSMITTED DISEASE      HPI  Patient presents for evaluation and request for treatment for STD. States he's been having white discharge from his penis for the last 5-7 days. Was at a clinic 2 days ago. Had bloody swabs obtained. States that he received a phone call this morning that he was positive for GC chlamydia. Negative via serum for HIV. Presents for treatment.  Past Medical History  Diagnosis Date  . Attention deficit disorder   . STD (male)   . Chlamydia    History reviewed. No pertinent past surgical history. Family History  Problem Relation Age of Onset  . Cancer Other   . Rheum arthritis Other    Social History  Substance Use Topics  . Smoking status: Current Every Day Smoker  . Smokeless tobacco: Never Used  . Alcohol Use: 0.0 oz/week    Review of Systems  Constitutional: Negative for fever, chills, diaphoresis, appetite change and fatigue.  HENT: Negative for mouth sores, sore throat and trouble swallowing.   Eyes: Negative for visual disturbance.  Respiratory: Negative for cough, chest tightness, shortness of breath and wheezing.   Cardiovascular: Negative for chest pain.  Gastrointestinal: Negative for nausea, vomiting, abdominal pain, diarrhea and abdominal distention.  Endocrine: Negative for polydipsia, polyphagia and polyuria.  Genitourinary: Positive for discharge. Negative for dysuria, frequency and hematuria.  Musculoskeletal: Negative for gait problem.  Skin: Negative for color change, pallor and rash.  Neurological: Negative for dizziness, syncope, light-headedness and headaches.  Hematological: Does not bruise/bleed easily.  Psychiatric/Behavioral: Negative for behavioral problems and confusion.      Allergies  Review of patient's allergies indicates no known  allergies.  Home Medications   Prior to Admission medications   Not on File   BP 126/65 mmHg  Pulse 90  Temp(Src) 98.5 F (36.9 C) (Oral)  Resp 18  Ht  (1.88 m)  Wt 156 lb (70.761 kg)  BMI 20.02 kg/m2  SpO2 99% Physical Exam  Constitutional: He is oriented to person, place, and time. He appears well-developed and well-nourished. No distress.  HENT:  Head: Normocephalic.  Eyes: Conjunctivae are normal. Pupils are equal, round, and reactive to light. No scleral icterus.  Neck: Normal range of motion. Neck supple. No thyromegaly present.  Cardiovascular: Normal rate and regular rhythm.  Exam reveals no gallop and no friction rub.   No murmur heard. Pulmonary/Chest: Effort normal and breath sounds normal. No respiratory distress. He has no wheezes. He has no rales.  Abdominal: Soft. Bowel sounds are normal. He exhibits no distension. There is no tenderness. There is no rebound.  Genitourinary:  No erythema the skin. Normal appearance of the skin and genitalia. No vesicles. No lymphadenopathy to the inguinal region  Musculoskeletal: Normal range of motion.  Neurological: He is alert and oriented to person, place, and time.  Skin: Skin is warm and dry. No rash noted.  Psychiatric: He has a normal mood and affect. His behavior is normal.    ED Course  Procedures (including critical care time) Labs Review Labs Reviewed - No data to display  Imaging Review No results found. I have personally reviewed and evaluated these images and lab results as part of my medical decision-making.   EKG Interpretation None      MDM  Final diagnoses:  Urethritis    Patient has confirmed GC and chlamydia from testing. Received a phone call at home. Was HIV negative. Given Rocephin IM, Flagyl by mouth, Zithromax by mouth. Asked use condoms and notify her sexual partners    Rolland Porter, MD 08/13/15 1309

## 2015-08-13 NOTE — ED Notes (Signed)
Patient states he was told he has STD's when went to clinic, but he stated has no insurance for treatment.

## 2016-01-23 ENCOUNTER — Emergency Department (HOSPITAL_BASED_OUTPATIENT_CLINIC_OR_DEPARTMENT_OTHER)
Admission: EM | Admit: 2016-01-23 | Discharge: 2016-01-23 | Disposition: A | Discharge status: Home / Self Care | Payer: Self-pay | Attending: Emergency Medicine | Admitting: Emergency Medicine

## 2016-01-23 ENCOUNTER — Encounter (HOSPITAL_BASED_OUTPATIENT_CLINIC_OR_DEPARTMENT_OTHER): Payer: Self-pay

## 2016-01-23 DIAGNOSIS — Z711 Person with feared health complaint in whom no diagnosis is made: Secondary | ICD-10-CM | POA: Insufficient documentation

## 2016-01-23 DIAGNOSIS — Z8659 Personal history of other mental and behavioral disorders: Secondary | ICD-10-CM | POA: Insufficient documentation

## 2016-01-23 DIAGNOSIS — Z8619 Personal history of other infectious and parasitic diseases: Secondary | ICD-10-CM | POA: Insufficient documentation

## 2016-01-23 DIAGNOSIS — Z87891 Personal history of nicotine dependence: Secondary | ICD-10-CM | POA: Insufficient documentation

## 2016-01-23 DIAGNOSIS — N5089 Other specified disorders of the male genital organs: Secondary | ICD-10-CM | POA: Insufficient documentation

## 2016-01-23 DIAGNOSIS — Z113 Encounter for screening for infections with a predominantly sexual mode of transmission: Secondary | ICD-10-CM | POA: Insufficient documentation

## 2016-01-23 LAB — URINALYSIS, ROUTINE W REFLEX MICROSCOPIC
Bilirubin Urine: NEGATIVE
GLUCOSE, UA: NEGATIVE mg/dL
HGB URINE DIPSTICK: NEGATIVE
Ketones, ur: 15 mg/dL — AB
Leukocytes, UA: NEGATIVE
Nitrite: NEGATIVE
PROTEIN: NEGATIVE mg/dL
Specific Gravity, Urine: 1.026 (ref 1.005–1.030)
pH: 7.5 (ref 5.0–8.0)

## 2016-01-23 NOTE — ED Notes (Signed)
Pt reports that he wants to "get physical" with a friend and wants to be checked. Pt denies any symptoms at this time.

## 2016-01-23 NOTE — ED Provider Notes (Signed)
CSN: [id number]     Arrival date & time 01/23/16  1726 History   First MD Initiated Contact with Patient 01/23/16 1753     Chief Complaint  Patient presents with  . Exposure to STD     (Consider location/radiation/quality/duration/timing/severity/associated sxs/prior Treatment) HPI  25 year old male with prior STD including chlamydia presenting requesting for an STD check. Patient states he is starting a relationship with a partner but has not had sexual activities yet. His partner noticed that patient has several bumps on his penis and was concern of herpes, patient states these bumps has been present on his penis throughout his life and has not caused any symptoms.  He denies having penile discharge, dysuria, penile pain or blood in urine.he has has 2 separate sexual partners within the past 6 months and using protection each time. Patient states he recently had an HIV and syphilis test and states that it was negative.   Past Medical History  Diagnosis Date  . Attention deficit disorder   . STD (male)   . Chlamydia    History reviewed. No pertinent past surgical history. Family History  Problem Relation Age of Onset  . Cancer Other   . Rheum arthritis Other    Social History  Substance Use Topics  . Smoking status: Former Smoker    Quit date: 12/26/2015  . Smokeless tobacco: Never Used  . Alcohol Use: 0.0 oz/week    Review of Systems  Constitutional: Negative for fever.  Genitourinary: Positive for genital sores. Negative for dysuria, flank pain, discharge, penile swelling, scrotal swelling, penile pain and testicular pain.  Skin: Negative for rash and wound.      Allergies  Review of patient's allergies indicates no known allergies.  Home Medications   Prior to Admission medications   Not on File   BP 159/87 mmHg  Pulse 86  Temp(Src) 98.1 F (36.7 C) (Oral)  Resp 18  Ht  (1.854 m)  Wt 74.844 kg  BMI 21.77 kg/m2  SpO2 100% Physical Exam   Constitutional: He appears well-developed and well-nourished. No distress.  HENT:  Head: Atraumatic.  Eyes: Conjunctivae are normal.  Neck: Neck supple.  Abdominal: Soft. There is no tenderness.  Genitourinary:  Chaperone present during exam. Circumcised penis. four <2 mm papular lesions noted near the frenulum of the gland of penis, non tender without erythema.  No vesicular lesion.  Testicles with normal lies, nontender, normal scrotum.  NO inguinal lymphadenopathy or hernia noted.    Neurological: He is alert.  Skin: No rash noted.  Psychiatric: He has a normal mood and affect.  Nursing note and vitals reviewed.   ED Course  Procedures (including critical care time) Labs Review Labs Reviewed  URINALYSIS, ROUTINE W REFLEX MICROSCOPIC (NOT AT [facility]) - Abnormal; Notable for the following:    Ketones, ur 15 (*)    All other components within normal limits    Imaging Review No results found. I have personally reviewed and evaluated these images and lab results as part of my medical decision-making.   EKG Interpretation None      MDM   Final diagnoses:  Physically well but worried  Screening for STD (sexually transmitted disease)    BP 159/87 mmHg  Pulse 86  Temp(Src) 98.1 F (36.7 C) (Oral)  Resp 18  Ht  (1.854 m)  Wt 74.844 kg  BMI 21.77 kg/m2  SpO2 100%   Pt here requesting for STD check.  Will send GC/Ch urine sample.  He  has several small papular lesions to the penile shaft, which does not appear infected and doubt herpes.  Recommend pt to go to the health clinic for further testing.     Fayrene HelperBowie Rosy Estabrook, PA-C 01/23/16 1837  Tilden FossaElizabeth Rees, MD 01/24/16 1447

## 2016-01-23 NOTE — Discharge Instructions (Signed)
Please follow up at the [facility] for further testing if needed.

## 2016-01-26 LAB — GC/CHLAMYDIA PROBE AMP (~~LOC~~) NOT AT ARMC
Chlamydia: NEGATIVE
Neisseria Gonorrhea: NEGATIVE

## 2016-10-26 ENCOUNTER — Encounter (HOSPITAL_COMMUNITY): Payer: Self-pay | Admitting: Emergency Medicine

## 2016-10-26 ENCOUNTER — Emergency Department (HOSPITAL_COMMUNITY)
Admission: EM | Admit: 2016-10-26 | Discharge: 2016-10-26 | Disposition: A | Discharge status: Home / Self Care | Payer: No Typology Code available for payment source | Attending: Emergency Medicine | Admitting: Emergency Medicine

## 2016-10-26 DIAGNOSIS — Y999 Unspecified external cause status: Secondary | ICD-10-CM | POA: Diagnosis not present

## 2016-10-26 DIAGNOSIS — Y939 Activity, unspecified: Secondary | ICD-10-CM | POA: Insufficient documentation

## 2016-10-26 DIAGNOSIS — S299XXA Unspecified injury of thorax, initial encounter: Secondary | ICD-10-CM | POA: Insufficient documentation

## 2016-10-26 DIAGNOSIS — Z7982 Long term (current) use of aspirin: Secondary | ICD-10-CM | POA: Diagnosis not present

## 2016-10-26 DIAGNOSIS — Z87891 Personal history of nicotine dependence: Secondary | ICD-10-CM | POA: Insufficient documentation

## 2016-10-26 DIAGNOSIS — M7918 Myalgia, other site: Secondary | ICD-10-CM

## 2016-10-26 DIAGNOSIS — Y92410 Unspecified street and highway as the place of occurrence of the external cause: Secondary | ICD-10-CM | POA: Insufficient documentation

## 2016-10-26 MED ORDER — IBUPROFEN 600 MG PO TABS: 600.0000 mg | ORAL_TABLET | Freq: Four times a day (QID) | ORAL | 0 refills | Status: AC | PRN
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
## 2016-10-26 NOTE — ED Notes (Signed)
ED Provider at bedside. 

## 2016-11-16 ENCOUNTER — Encounter (HOSPITAL_BASED_OUTPATIENT_CLINIC_OR_DEPARTMENT_OTHER): Payer: Self-pay | Admitting: *Deleted

## 2016-11-16 ENCOUNTER — Emergency Department (HOSPITAL_BASED_OUTPATIENT_CLINIC_OR_DEPARTMENT_OTHER)
Admission: EM | Admit: 2016-11-16 | Discharge: 2016-11-17 | Disposition: A | Discharge status: Home / Self Care | Payer: Self-pay | Attending: Emergency Medicine | Admitting: Emergency Medicine

## 2016-11-16 DIAGNOSIS — Z87891 Personal history of nicotine dependence: Secondary | ICD-10-CM | POA: Insufficient documentation

## 2016-11-16 DIAGNOSIS — Y999 Unspecified external cause status: Secondary | ICD-10-CM | POA: Insufficient documentation

## 2016-11-16 DIAGNOSIS — Y939 Activity, unspecified: Secondary | ICD-10-CM | POA: Insufficient documentation

## 2016-11-16 DIAGNOSIS — S01312A Laceration without foreign body of left ear, initial encounter: Secondary | ICD-10-CM | POA: Insufficient documentation

## 2016-11-16 DIAGNOSIS — Y929 Unspecified place or not applicable: Secondary | ICD-10-CM | POA: Insufficient documentation

## 2016-11-16 MED ORDER — LIDOCAINE HCL 2 % IJ SOLN
INTRAMUSCULAR | Status: AC
  Filled 2016-11-16: qty 20
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
## 2016-11-17 MED ORDER — LIDOCAINE HCL 2 % IJ SOLN
20.0000 mL | Freq: Once | INTRAMUSCULAR | Status: AC
  Administered 2016-11-17: 400 mg
  Filled 2016-11-17: qty 20

## 2016-11-17 MED ORDER — AMOXICILLIN-POT CLAVULANATE 875-125 MG PO TABS: 1.0000 | ORAL_TABLET | Freq: Two times a day (BID) | ORAL | 0 refills | Status: DC

## 2016-11-17 MED ORDER — HYDROCODONE-ACETAMINOPHEN 5-325 MG PO TABS: 1.0000 | ORAL_TABLET | Freq: Four times a day (QID) | ORAL | 0 refills | Status: DC | PRN

## 2016-11-17 NOTE — ED Notes (Signed)
ED Provider at bedside. [place] Emergency planning/management officerolice officer at bedside taking pt report. EDP preparing to repair ear lac.

## 2016-11-20 ENCOUNTER — Encounter (HOSPITAL_BASED_OUTPATIENT_CLINIC_OR_DEPARTMENT_OTHER): Payer: Self-pay | Admitting: Emergency Medicine

## 2016-11-20 ENCOUNTER — Emergency Department (HOSPITAL_BASED_OUTPATIENT_CLINIC_OR_DEPARTMENT_OTHER)
Admission: EM | Admit: 2016-11-20 | Discharge: 2016-11-20 | Disposition: A | Discharge status: Home / Self Care | Payer: Self-pay | Attending: Emergency Medicine | Admitting: Emergency Medicine

## 2016-11-20 DIAGNOSIS — R109 Unspecified abdominal pain: Secondary | ICD-10-CM | POA: Insufficient documentation

## 2016-11-20 DIAGNOSIS — Z87891 Personal history of nicotine dependence: Secondary | ICD-10-CM | POA: Insufficient documentation

## 2016-11-20 DIAGNOSIS — Z202 Contact with and (suspected) exposure to infections with a predominantly sexual mode of transmission: Secondary | ICD-10-CM | POA: Insufficient documentation

## 2016-11-20 LAB — URINALYSIS, ROUTINE W REFLEX MICROSCOPIC
Bilirubin Urine: NEGATIVE
Glucose, UA: NEGATIVE mg/dL
Hgb urine dipstick: NEGATIVE
Ketones, ur: >80 mg/dL — AB
Leukocytes, UA: NEGATIVE
NITRITE: NEGATIVE
PH: 7.5 (ref 5.0–8.0)
Protein, ur: NEGATIVE mg/dL
Specific Gravity, Urine: 1.026 (ref 1.005–1.030)

## 2016-11-20 MED ORDER — AZITHROMYCIN 250 MG PO TABS
1000.0000 mg | ORAL_TABLET | Freq: Once | ORAL | Status: AC
  Administered 2016-11-20: 1000 mg via ORAL
  Filled 2016-11-20: qty 4

## 2016-11-20 MED ORDER — CEFTRIAXONE SODIUM 250 MG IJ SOLR
250.0000 mg | Freq: Once | INTRAMUSCULAR | Status: AC
  Administered 2016-11-20: 250 mg via INTRAMUSCULAR
  Filled 2016-11-20: qty 250
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
## 2016-11-20 NOTE — ED Provider Notes (Signed)
[facility] Provider Note   CSN: [id number] Arrival date & time: 11/20/16  1612  By signing my name below, I, Orpah CobbMaurice Copeland, attest that this documentation has been prepared under the direction and in the presence of Felicie Mornavid Lenora Gomes, NP-C. Electronically Signed: Orpah CobbMaurice Copeland , ED Scribe. 11/20/16. 7:01 PM.   History   Chief Complaint Chief Complaint  Patient presents with  . Exposure to STD    HPI  Christian Beltran is a 26 y.o. male with hx of chlamydia who presents to the Emergency Department for evaluation of penile discharge with sudden onset x3 days ago s/p STD exposure. Pt states that he had sex x2 weeks ago where the condom broke. Since, he has clear penile discharge and mild abdominal pain. His partner informed him that he needed to be treated due to her positive test. Pt denies any other complaints.    The history is provided by the patient. No language interpreter was used.    Past Medical History:  Diagnosis Date  . Attention deficit disorder   . Chlamydia   . STD (male)     There are no active problems to display for this patient.   History reviewed. No pertinent surgical history.     Home Medications    Prior to Admission medications   Medication Sig Start Date End Date Taking? Authorizing Provider  amoxicillin-clavulanate (AUGMENTIN) 875-125 MG tablet Take 1 tablet by mouth every 12 (twelve) hours. 11/17/16   Tilden FossaElizabeth Rees, MD  Aspirin-Salicylamide-Caffeine [facility](BC HEADACHE POWDER PO) Take 1 packet by mouth every 6 (six) hours as needed. Pain    Historical Provider, MD  HYDROcodone-acetaminophen (NORCO/VICODIN) 5-325 MG tablet Take 1 tablet by mouth every 6 (six) hours as needed. 11/17/16   Tilden FossaElizabeth Rees, MD  ibuprofen (ADVIL,MOTRIN) 600 MG tablet Take 1 tablet (600 mg total) by mouth every 6 (six) hours as needed. 10/26/16   Nira ConnPedro Eduardo Cardama, MD    Family History Family History  Problem Relation Age of Onset  . Cancer Other   . Rheum  arthritis Other     Social History Social History  Substance Use Topics  . Smoking status: Former Smoker    Quit date: 12/26/2015  . Smokeless tobacco: Never Used  . Alcohol use 0.0 oz/week     Comment: occasionally     Allergies   Patient has no known allergies.   Review of Systems Review of Systems  Constitutional: Negative for chills and fever.  Gastrointestinal: Positive for abdominal pain.  Genitourinary: Positive for discharge.  All other systems reviewed and are negative.    Physical Exam Updated Vital Signs BP 145/100   Pulse 95   Temp 98.3 F (36.8 C) (Oral)   Resp 16   SpO2 100%   Physical Exam  Constitutional: He appears well-developed and well-nourished.  HENT:  Head: Normocephalic and atraumatic.  Eyes: Conjunctivae are normal.  Neck: Neck supple.  Cardiovascular: Normal rate and regular rhythm.   No murmur heard. Pulmonary/Chest: Effort normal and breath sounds normal. No respiratory distress.  Abdominal: Soft. There is no tenderness.  Genitourinary: Testes normal and penis normal. Right testis shows no tenderness. Left testis shows no tenderness. No penile tenderness.  Genitourinary Comments: No discharge evident at meatus. No rash or lesions.  Musculoskeletal: He exhibits no edema.  Neurological: He is alert.  Skin: Skin is warm and dry.  Psychiatric: He has a normal mood and affect.  Nursing note and vitals reviewed.    ED Treatments / Results  DIAGNOSTIC STUDIES: Oxygen Saturation is 100% on RA, normal by my interpretation.   COORDINATION OF CARE: 7:02 PM-Discussed next steps with pt. Pt verbalized understanding and is agreeable with the plan.   Labs (all labs ordered are listed, but only abnormal results are displayed) Labs Reviewed  URINALYSIS, ROUTINE W REFLEX MICROSCOPIC - Abnormal; Notable for the following:       Result Value   Ketones, ur >80 (*)    All other components within normal limits  GC/CHLAMYDIA PROBE AMP ([facility]) NOT AT [facility]    EKG  EKG Interpretation None       Radiology No results found.  Procedures Procedures (including critical care time)  Medications Ordered in ED Medications - No data to display   Initial Impression / Assessment and Plan / ED Course  I have reviewed the triage vital signs and the nursing notes.  Pertinent labs & imaging results that were available during my care of the patient were reviewed by me and considered in my medical decision making (see chart for details).  Clinical Course    Patient treated in the ED for STI with rocephin and azithromycin. Patient advised to inform and treat all sexual partners.  Pt advised on safe sex practices and understands that they have GC/Chlamydia cultures pending and will result in 2-3 days. Patient declined HIV and RPR. Pt encouraged to follow up at local health department for future STI checks. No concern for prostatitis or epididymitis. Discussed return precautions. Pt appears safe for discharge.    Final Clinical Impressions(s) / ED Diagnoses   Final diagnoses:  None    New Prescriptions New Prescriptions   No medications on file   I personally performed the services described in this documentation, which was scribed in my presence. The recorded information has been reviewed and is accurate.     Felicie Morn, NP 11/21/16 1610    Raeford Razor, MD 11/29/16 [phone]

## 2016-11-22 LAB — GC/CHLAMYDIA PROBE AMP (~~LOC~~) NOT AT ARMC
Chlamydia: NEGATIVE
Neisseria Gonorrhea: NEGATIVE

## 2016-12-20 ENCOUNTER — Encounter (HOSPITAL_BASED_OUTPATIENT_CLINIC_OR_DEPARTMENT_OTHER): Payer: Self-pay | Admitting: *Deleted

## 2016-12-20 ENCOUNTER — Emergency Department (HOSPITAL_BASED_OUTPATIENT_CLINIC_OR_DEPARTMENT_OTHER)
Admission: EM | Admit: 2016-12-20 | Discharge: 2016-12-20 | Disposition: A | Discharge status: Home / Self Care | Payer: Self-pay | Attending: Dermatology | Admitting: Dermatology

## 2016-12-20 DIAGNOSIS — F1290 Cannabis use, unspecified, uncomplicated: Secondary | ICD-10-CM | POA: Insufficient documentation

## 2016-12-20 DIAGNOSIS — Z4802 Encounter for removal of sutures: Secondary | ICD-10-CM | POA: Insufficient documentation

## 2016-12-20 DIAGNOSIS — Z87891 Personal history of nicotine dependence: Secondary | ICD-10-CM | POA: Insufficient documentation

## 2016-12-20 DIAGNOSIS — Z79899 Other long term (current) drug therapy: Secondary | ICD-10-CM | POA: Insufficient documentation

## 2016-12-20 DIAGNOSIS — Z5321 Procedure and treatment not carried out due to patient leaving prior to being seen by health care provider: Secondary | ICD-10-CM | POA: Insufficient documentation

## 2016-12-20 NOTE — ED Triage Notes (Signed)
Here for suture removal a month ago.

## 2017-04-03 ENCOUNTER — Encounter (HOSPITAL_BASED_OUTPATIENT_CLINIC_OR_DEPARTMENT_OTHER): Payer: Self-pay | Admitting: Emergency Medicine

## 2017-04-03 ENCOUNTER — Emergency Department (HOSPITAL_BASED_OUTPATIENT_CLINIC_OR_DEPARTMENT_OTHER)
Admission: EM | Admit: 2017-04-03 | Discharge: 2017-04-03 | Disposition: A | Discharge status: Home / Self Care | Payer: Self-pay | Attending: Physician Assistant | Admitting: Physician Assistant

## 2017-04-03 DIAGNOSIS — Z87891 Personal history of nicotine dependence: Secondary | ICD-10-CM | POA: Insufficient documentation

## 2017-04-03 DIAGNOSIS — R200 Anesthesia of skin: Secondary | ICD-10-CM | POA: Insufficient documentation

## 2017-04-03 DIAGNOSIS — F1290 Cannabis use, unspecified, uncomplicated: Secondary | ICD-10-CM | POA: Insufficient documentation

## 2017-04-03 LAB — COMPREHENSIVE METABOLIC PANEL
ALK PHOS: 64 U/L (ref 38–126)
ALT: 22 U/L (ref 17–63)
AST: 30 U/L (ref 15–41)
Albumin: 4.8 g/dL (ref 3.5–5.0)
Anion gap: 12 (ref 5–15)
BILIRUBIN TOTAL: 1 mg/dL (ref 0.3–1.2)
BUN: 18 mg/dL (ref 6–20)
CALCIUM: 9.8 mg/dL (ref 8.9–10.3)
CO2: 27 mmol/L (ref 22–32)
Chloride: 99 mmol/L — ABNORMAL LOW (ref 101–111)
Creatinine, Ser: 0.97 mg/dL (ref 0.61–1.24)
GFR calc non Af Amer: >60 mL/min (ref >60)
Glucose, Bld: 110 mg/dL — ABNORMAL HIGH (ref 65–99)
Potassium: 3.7 mmol/L (ref 3.5–5.1)
Sodium: 138 mmol/L (ref 135–145)
TOTAL PROTEIN: 8.3 g/dL — AB (ref 6.5–8.1)

## 2017-04-03 LAB — CBC WITH DIFFERENTIAL/PLATELET
BASOS PCT: 0 %
Basophils Absolute: 0 10*3/uL (ref 0.0–0.1)
EOS ABS: 0.1 10*3/uL (ref 0.0–0.7)
Eosinophils Relative: 1 %
HCT: 41.8 % (ref 39.0–52.0)
Hemoglobin: 14.3 g/dL (ref 13.0–17.0)
LYMPHS ABS: 2.5 10*3/uL (ref 0.7–4.0)
Lymphocytes Relative: 24 %
MCH: 26.9 pg (ref 26.0–34.0)
MCHC: 34.2 g/dL (ref 30.0–36.0)
MCV: 78.6 fL (ref 78.0–100.0)
Monocytes Absolute: 0.8 10*3/uL (ref 0.1–1.0)
Monocytes Relative: 8 %
Neutro Abs: 7 10*3/uL (ref 1.7–7.7)
Neutrophils Relative %: 67 %
Platelets: 275 10*3/uL (ref 150–400)
RBC: 5.32 MIL/uL (ref 4.22–5.81)
RDW: 14.5 % (ref 11.5–15.5)
WBC: 10.5 10*3/uL (ref 4.0–10.5)

## 2017-04-03 MED ORDER — SODIUM CHLORIDE 0.9 % IV BOLUS (SEPSIS)
1000.0000 mL | Freq: Once | INTRAVENOUS | Status: AC
  Administered 2017-04-03: 1000 mL via INTRAVENOUS

## 2017-04-03 NOTE — ED Triage Notes (Signed)
Patient stats that about 3 weeks ago he started to have numbness in his right arm and leg. It went away about a week later, reports that now for the last 3 days he has pain similar on the left side of his body from his elbow down to his hand, and from his knee to his foot.

## 2017-04-03 NOTE — ED Provider Notes (Signed)
[facility]   CSN: [id number] Arrival date & time: 04/03/17  1319     History   Chief Complaint Chief Complaint  Patient presents with  . Numbness    HPI Gladstone PihJamahrie Apt is a 26 y.o. male.  HPI   Patient is a [age] year old male presenting with numbness. Patient reports numbness in several different parts of his body that comes and goes. Patient says he thinks it might be from drinking. Patient reports that he knows that he is not taking enough fluids and not enough vegetables now that he moved out of his mom's house. Patient has a book at bedside about diet.  Patient says that he fell asleep last week after drinking and had some numbness and arm. We discussed peripheral neuropathy. However he reports that today he did not sleep on his left arm and now has tiny area of numbness over the left bicep and area on his left calf.   Patient has no weakness. Ambulatory. Eating and drinking normally. No visual complaints.  Past Medical History:  Diagnosis Date  . Attention deficit disorder   . Chlamydia   . STD (male)     There are no active problems to display for this patient.   History reviewed. No pertinent surgical history.     Home Medications    Prior to Admission medications   Medication Sig Start Date End Date Taking? Authorizing Provider  amoxicillin-clavulanate (AUGMENTIN) 875-125 MG tablet Take 1 tablet by mouth every 12 (twelve) hours. 11/17/16   Tilden Fossaees, Elizabeth, MD  Aspirin-Salicylamide-Caffeine [facility](BC HEADACHE POWDER PO) Take 1 packet by mouth every 6 (six) hours as needed. Pain    [provider]  HYDROcodone-acetaminophen (NORCO/VICODIN) 5-325 MG tablet Take 1 tablet by mouth every 6 (six) hours as needed. 11/17/16   Tilden Fossaees, Elizabeth, MD  ibuprofen (ADVIL,MOTRIN) 600 MG tablet Take 1 tablet (600 mg total) by mouth every 6 (six) hours as needed. 10/26/16   Nira Connardama, Pedro Eduardo, MD    Family History Family History  Problem  Relation Age of Onset  . Cancer Other   . Rheum arthritis Other     Social History Social History  Substance Use Topics  . Smoking status: Former Smoker    Quit date: 12/26/2015  . Smokeless tobacco: Never Used  . Alcohol use 0.0 oz/week     Comment: occasionally     Allergies   Patient has no known allergies.   Review of Systems Review of Systems  Constitutional: Negative for fatigue and fever.  Respiratory: Negative for chest tightness.   Genitourinary: Negative for dysuria.  Neurological: Positive for numbness. Negative for dizziness, syncope, facial asymmetry, speech difficulty and weakness.  All other systems reviewed and are negative.    Physical Exam Updated Vital Signs BP 131/89 (BP Location: Right Arm)   Pulse 84   Temp 99.1 F (37.3 C) (Oral)   Resp 18   Ht 6\' 1"  (1.854 m)   Wt 70.3 kg (155 lb)   SpO2 100%   BMI 20.45 kg/m   Physical Exam  Constitutional: He appears well-developed and well-nourished.  HENT:  Head: Normocephalic and atraumatic.  Eyes: Conjunctivae are normal.  Neck: Neck supple.  Cardiovascular: Normal rate and regular rhythm.   No murmur heard. Pulmonary/Chest: Effort normal and breath sounds normal. No respiratory distress.  Abdominal: Soft. There is no tenderness.  Musculoskeletal: He exhibits no edema.  Normal strength and sensation bilateral upper and lower extremities. Patient is ambulatory with a steady gait.  Neurological: He is alert.  Skin: Skin is warm and dry.  No skin lesions on palms.  Psychiatric: He has a normal mood and affect.  Nursing note and vitals reviewed.    ED Treatments / Results  Labs (all labs ordered are listed, but only abnormal results are displayed) Labs Reviewed  COMPREHENSIVE METABOLIC PANEL  CBC WITH DIFFERENTIAL/PLATELET    EKG  EKG Interpretation None       Radiology No results found.  Procedures Procedures (including critical care time)  Medications Ordered in  ED Medications  sodium chloride 0.9 % bolus 1,000 mL (not administered)     Initial Impression / Assessment and Plan / ED Course  I have reviewed the triage vital signs and the nursing notes.  Pertinent labs & imaging results that were available during my care of the patient were reviewed by me and considered in my medical decision making (see chart for details).     Patient's a young male here with odd constellation of numbness and parts of his body. Patient is very anxious about how diet is affecting his body. We discussed how drinking and not eating properly can lead to some neurologic symptoms. However I doubt this is the case with him given that he only drinks a couple times a week. We decided together that he will go week without drinking,being sure to have plenty of fluids and eating a good diet and see how he feels in one week. Patient reports that even after talking to me for 5 minutes his numbness is resolved. He said "I think it all might be in my mind".  We will get labs checked baseline electrolytes, give a liter fluid. Continue reassurance and outpatient follow-up with primary care physician. I doubt serious cause of numbness such as stroke, MS, neurosyphilis or other intracranial pathology given the fleeting nature and irregular pattern.  Final Clinical Impressions(s) / ED Diagnoses   Final diagnoses:  None    New Prescriptions New Prescriptions   No medications on file     Abelino Derrick, MD 04/03/17 1528

## 2017-04-03 NOTE — Discharge Instructions (Signed)
Please take multivitamen and stay hydrated.

## 2017-04-03 NOTE — ED Notes (Signed)
Sprite given per patient request.  

## 2017-06-06 ENCOUNTER — Emergency Department (HOSPITAL_BASED_OUTPATIENT_CLINIC_OR_DEPARTMENT_OTHER)
Admission: EM | Admit: 2017-06-06 | Discharge: 2017-06-06 | Disposition: A | Discharge status: Home / Self Care | Payer: Self-pay | Attending: Emergency Medicine | Admitting: Emergency Medicine

## 2017-06-06 ENCOUNTER — Encounter (HOSPITAL_BASED_OUTPATIENT_CLINIC_OR_DEPARTMENT_OTHER): Payer: Self-pay | Admitting: *Deleted

## 2017-06-06 DIAGNOSIS — R369 Urethral discharge, unspecified: Secondary | ICD-10-CM | POA: Insufficient documentation

## 2017-06-06 DIAGNOSIS — A64 Unspecified sexually transmitted disease: Secondary | ICD-10-CM

## 2017-06-06 DIAGNOSIS — Z202 Contact with and (suspected) exposure to infections with a predominantly sexual mode of transmission: Secondary | ICD-10-CM | POA: Insufficient documentation

## 2017-06-06 DIAGNOSIS — Z87891 Personal history of nicotine dependence: Secondary | ICD-10-CM | POA: Insufficient documentation

## 2017-06-06 MED ORDER — DOXYCYCLINE HYCLATE 100 MG PO CAPS: 100.0000 mg | ORAL_CAPSULE | Freq: Two times a day (BID) | ORAL | 0 refills | Status: AC

## 2017-06-06 MED ORDER — DOXYCYCLINE HYCLATE 100 MG PO TABS
100.0000 mg | ORAL_TABLET | Freq: Once | ORAL | Status: AC
  Administered 2017-06-06: 100 mg via ORAL
  Filled 2017-06-06: qty 1

## 2017-06-06 MED ORDER — METRONIDAZOLE 500 MG PO TABS
2000.0000 mg | ORAL_TABLET | Freq: Once | ORAL | Status: AC
  Administered 2017-06-06: 2000 mg via ORAL
  Filled 2017-06-06: qty 4

## 2017-06-06 MED ORDER — CEFTRIAXONE SODIUM 250 MG IJ SOLR
250.0000 mg | Freq: Once | INTRAMUSCULAR | Status: AC
  Administered 2017-06-06: 250 mg via INTRAMUSCULAR
  Filled 2017-06-06: qty 250

## 2017-06-06 MED FILL — DOXYCYCLINE HYCLATE 100 MG: 100 | 10 days supply | Qty: 20 | Fill #0

## 2017-06-06 NOTE — ED Triage Notes (Signed)
Pt reports 4-5 days of penile discharge

## 2017-06-06 NOTE — Discharge Instructions (Signed)
Practice safe sex and have all partners evaluated and treated at the local health department. Also advise you to follow with the health Department in 1-2 weeks to confirm effectiveness of treatment and receive additional education/evaluation.

## 2017-06-06 NOTE — ED Provider Notes (Signed)
[facility]   CSN: [id number] Arrival date & time: 06/06/17  1251     History   Chief Complaint Chief Complaint  Patient presents with  . Penile Discharge    HPI Christian Beltran is a 26 y.o. male.  The history is provided by the patient.  Penile Discharge  This is a new problem. Episode onset: 4-5 days. The problem occurs constantly. The problem has not changed since onset.Pertinent negatives include no chest pain, no abdominal pain, no headaches and no shortness of breath. Nothing aggravates the symptoms. Nothing relieves the symptoms. He has tried nothing for the symptoms.    Patient reports history of prior Chlamydia/gonorrhea infection. States that this feels similar to prior presentation.  Reports that he has had one sexual partner but that partner has left the country. Believes that this partner was the one who exposed him to the STI. He has already informed the partner to be checked and treated.  Past Medical History:  Diagnosis Date  . Attention deficit disorder   . Chlamydia   . STD (male)     There are no active problems to display for this patient.   History reviewed. No pertinent surgical history.     Home Medications    Prior to Admission medications   Medication Sig Start Date End Date Taking? Authorizing Provider  amoxicillin-clavulanate (AUGMENTIN) 875-125 MG tablet Take 1 tablet by mouth every 12 (twelve) hours. 11/17/16   Tilden Fossaees, Elizabeth, MD  Aspirin-Salicylamide-Caffeine [facility](BC HEADACHE POWDER PO) Take 1 packet by mouth every 6 (six) hours as needed. Pain    [provider]  doxycycline (VIBRAMYCIN) 100 MG capsule Take 1 capsule (100 mg total) by mouth 2 (two) times daily. 06/06/17 06/16/17  Nira Connardama, Niv Darley Eduardo, MD  HYDROcodone-acetaminophen (NORCO/VICODIN) 5-325 MG tablet Take 1 tablet by mouth every 6 (six) hours as needed. 11/17/16   Tilden Fossaees, Elizabeth, MD  ibuprofen (ADVIL,MOTRIN) 600 MG tablet Take 1 tablet (600 mg  total) by mouth every 6 (six) hours as needed. 10/26/16   Nira Connardama, Rayvon Dakin Eduardo, MD    Family History Family History  Problem Relation Age of Onset  . Cancer Other   . Rheum arthritis Other     Social History Social History  Substance Use Topics  . Smoking status: Former Smoker    Quit date: 12/26/2015  . Smokeless tobacco: Never Used  . Alcohol use 0.6 oz/week    1 Cans of beer per week     Comment: 1 beer daily     Allergies   Patient has no known allergies.   Review of Systems Review of Systems  Respiratory: Negative for shortness of breath.   Cardiovascular: Negative for chest pain.  Gastrointestinal: Negative for abdominal pain.  Genitourinary: Positive for discharge.  Neurological: Negative for headaches.  All other systems are reviewed and are negative for acute change except as noted in the HPI    Physical Exam Updated Vital Signs BP 135/81 (BP Location: Right Arm)   Temp 98.3 F (36.8 C) (Oral)   Resp 18   Ht 6\' 1"  (1.854 m)   Wt 68 kg (150 lb)   SpO2 100%   BMI 19.79 kg/m   Physical Exam  Constitutional: He is oriented to person, place, and time. He appears well-developed and well-nourished. No distress.  HENT:  Head: Normocephalic and atraumatic.  Right Ear: External ear normal.  Left Ear: External ear normal.  Nose: Nose normal.  Mouth/Throat: Mucous membranes are normal. No trismus in  the jaw.  Eyes: Conjunctivae and EOM are normal. No scleral icterus.  Neck: Normal range of motion and phonation normal.  Cardiovascular: Normal rate and regular rhythm.   Pulmonary/Chest: Effort normal. No stridor. No respiratory distress.  Abdominal: He exhibits no distension.  Genitourinary: Testes normal. Right testis shows no swelling and no tenderness. Left testis shows no swelling and no tenderness. Circumcised. No penile erythema or penile tenderness. Discharge found.  Musculoskeletal: Normal range of motion. He exhibits no edema.  Lymphadenopathy: No  inguinal adenopathy noted on the right or left side.  Neurological: He is alert and oriented to person, place, and time.  Skin: He is not diaphoretic.  Psychiatric: He has a normal mood and affect. His behavior is normal.  Vitals reviewed.    ED Treatments / Results  Labs (all labs ordered are listed, but only abnormal results are displayed) Labs Reviewed  GC/CHLAMYDIA PROBE AMP ([place]) NOT AT [facility]    EKG  EKG Interpretation None       Radiology No results found.  Procedures Procedures (including critical care time)  Medications Ordered in ED Medications  metroNIDAZOLE (FLAGYL) tablet 2,000 mg (not administered)  cefTRIAXone (ROCEPHIN) injection 250 mg (not administered)  doxycycline (VIBRA-TABS) tablet 100 mg (not administered)     Initial Impression / Assessment and Plan / ED Course  I have reviewed the triage vital signs and the nursing notes.  Pertinent labs & imaging results that were available during my care of the patient were reviewed by me and considered in my medical decision making (see chart for details).     Treated empirically for gonorrhea/chlamydia and Trichomonas. Urine culture for GC chlamydia sent.   Advised to practice safe sex and have all partners evaluated and treated at the local health department. Also advised to follow with the health Department in 1-2 weeks to confirm effectiveness of treatment and receive additional education/evaluation. Return precautions given.   The patient is safe for discharge with strict return precautions.   Final Clinical Impressions(s) / ED Diagnoses   Final diagnoses:  Penile discharge  STI (sexually transmitted infection)   Disposition: Discharge  Condition: Good  I have discussed the results, Dx and Tx plan with the patient who expressed understanding and agree(s) with the plan. Discharge instructions discussed at great length. The patient was given strict return precautions who verbalized  understanding of the instructions. No further questions at time of discharge.    New Prescriptions   DOXYCYCLINE (VIBRAMYCIN) 100 MG CAPSULE    Take 1 capsule (100 mg total) by mouth 2 (two) times daily.    Follow Up: No follow-up provider specified.     Nira Connardama, Martine Trageser Eduardo, MD 06/06/17 1415

## 2017-06-07 LAB — GC/CHLAMYDIA PROBE AMP (~~LOC~~) NOT AT ARMC
Chlamydia: NEGATIVE
NEISSERIA GONORRHEA: NEGATIVE

## 2017-07-14 ENCOUNTER — Emergency Department (HOSPITAL_COMMUNITY)
Admission: EM | Admit: 2017-07-14 | Discharge: 2017-07-14 | Disposition: A | Discharge status: Home / Self Care | Payer: Self-pay | Attending: Emergency Medicine | Admitting: Emergency Medicine

## 2017-07-14 ENCOUNTER — Emergency Department (HOSPITAL_COMMUNITY): Payer: Self-pay

## 2017-07-14 ENCOUNTER — Encounter (HOSPITAL_COMMUNITY): Payer: Self-pay | Admitting: *Deleted

## 2017-07-14 DIAGNOSIS — Z202 Contact with and (suspected) exposure to infections with a predominantly sexual mode of transmission: Secondary | ICD-10-CM | POA: Insufficient documentation

## 2017-07-14 DIAGNOSIS — R079 Chest pain, unspecified: Secondary | ICD-10-CM | POA: Insufficient documentation

## 2017-07-14 DIAGNOSIS — Z87891 Personal history of nicotine dependence: Secondary | ICD-10-CM | POA: Insufficient documentation

## 2017-07-14 MED ORDER — CEFTRIAXONE SODIUM 250 MG IJ SOLR
250.0000 mg | Freq: Once | INTRAMUSCULAR | Status: AC
  Administered 2017-07-14: 250 mg via INTRAMUSCULAR
  Filled 2017-07-14: qty 250

## 2017-07-14 MED ORDER — ALBUTEROL SULFATE HFA 108 (90 BASE) MCG/ACT IN AERS: 1.0000 | INHALATION_SPRAY | Freq: Four times a day (QID) | RESPIRATORY_TRACT | 0 refills | Status: AC | PRN

## 2017-07-14 MED ORDER — LIDOCAINE HCL (PF) 1 % IJ SOLN
INTRAMUSCULAR | Status: AC
  Administered 2017-07-14: 5 mL
  Filled 2017-07-14: qty 5

## 2017-07-14 MED ORDER — AZITHROMYCIN 250 MG PO TABS
1000.0000 mg | ORAL_TABLET | Freq: Once | ORAL | Status: AC
  Administered 2017-07-14: 1000 mg via ORAL
  Filled 2017-07-14: qty 4

## 2017-07-14 NOTE — Discharge Instructions (Signed)
Please read attached information. If you experience any new or worsening signs or symptoms please return to the emergency room for evaluation. Please follow-up with your primary care provider or specialist as discussed. Please use medication prescribed only as directed and discontinue taking if you have any concerning signs or symptoms.   °

## 2017-07-14 NOTE — ED Triage Notes (Signed)
Pt has had intermittent chest pain for one week on left side.  Refused blood work.  Pt also needs to get tested for std, reports some clear penile discharge.

## 2017-07-14 NOTE — ED Provider Notes (Signed)
[facility]-EMERGENCY DEPT Provider Note   CSN: [id number] Arrival date & time: 07/14/17  1615     History   Chief Complaint Chief Complaint  Patient presents with  . Chest Pain    HPI Christian Stapel is a 26 y.o. male.  HPI   26 year old male presents today with several complaints. Patient notes that he was told last week that a sexual partner had gonorrhea. He notes at that time he was having chest discomfort which she attributes to anxiety. He has since then he's had left sided anterior chest pain after smoking marijuana. He notes this only happens when he smokes marijuana. He notes his last for several hours and then resolves on its own. He denies any baseline shortness of breath, history of DVT or PEs. He denies any cardiac history. Patient denies any fever, productive cough, or any other concerning signs or symptoms.     Past Medical History:  Diagnosis Date  . Attention deficit disorder   . Chlamydia   . STD (male)     There are no active problems to display for this patient.   History reviewed. No pertinent surgical history.     Home Medications    Prior to Admission medications   Medication Sig Start Date End Date Taking? Authorizing Provider  albuterol (PROVENTIL HFA;VENTOLIN HFA) 108 (90 Base) MCG/ACT inhaler Inhale 1-2 puffs into the lungs every 6 (six) hours as needed for wheezing or shortness of breath. 07/14/17   Macala Baldonado, Tinnie Gens, PA-C  amoxicillin-clavulanate (AUGMENTIN) 875-125 MG tablet Take 1 tablet by mouth every 12 (twelve) hours. 11/17/16   Tilden Fossa, MD  Aspirin-Salicylamide-Caffeine [facility] HEADACHE POWDER PO) Take 1 packet by mouth every 6 (six) hours as needed. Pain    [provider]  HYDROcodone-acetaminophen (NORCO/VICODIN) 5-325 MG tablet Take 1 tablet by mouth every 6 (six) hours as needed. 11/17/16   Tilden Fossa, MD  ibuprofen (ADVIL,MOTRIN) 600 MG tablet Take 1 tablet (600 mg total) by mouth every 6 (six) hours as needed. 10/26/16    Nira Conn, MD    Family History Family History  Problem Relation Age of Onset  . Cancer Other   . Rheum arthritis Other     Social History Social History  Substance Use Topics  . Smoking status: Former Smoker    Quit date: 12/26/2015  . Smokeless tobacco: Never Used  . Alcohol use 0.6 oz/week    1 Cans of beer per week     Comment: 1 beer daily     Allergies   Patient has no known allergies.   Review of Systems Review of Systems  All other systems reviewed and are negative.    Physical Exam Updated Vital Signs BP (!) 156/97 (BP Location: Right Arm)   Pulse 90   Temp 98.2 F (36.8 C) (Oral)   Resp 18   SpO2 100%   Physical Exam  Constitutional: He is oriented to person, place, and time. He appears well-developed and well-nourished.  HENT:  Head: Normocephalic and atraumatic.  Eyes: Pupils are equal, round, and reactive to light. Conjunctivae are normal. Right eye exhibits no discharge. Left eye exhibits no discharge. No scleral icterus.  Neck: Normal range of motion. No JVD present. No tracheal deviation present.  Cardiovascular: Normal rate, regular rhythm, normal heart sounds and intact distal pulses.  Exam reveals no gallop and no friction rub.   No murmur heard. Pulmonary/Chest: Effort normal and breath sounds normal. No stridor. No respiratory distress. He has no wheezes. He has  no rales. He exhibits no tenderness.  Musculoskeletal: He exhibits no edema.  Neurological: He is alert and oriented to person, place, and time. Coordination normal.  Psychiatric: He has a normal mood and affect. His behavior is normal. Judgment and thought content normal.  Nursing note and vitals reviewed.    ED Treatments / Results  Labs (all labs ordered are listed, but only abnormal results are displayed) Labs Reviewed - No data to display  EKG  EKG Interpretation None       Radiology Dg Chest 2 View  Result Date: 07/14/2017 CLINICAL DATA:  Chest pain  EXAM: CHEST  2 VIEW COMPARISON:  Chest x-ray of 08/02/2015 FINDINGS: No active infiltrate or effusion is seen. Mediastinal and hilar contours are unremarkable. The heart is within normal limits in size. No acute bony abnormality is seen. IMPRESSION: No active cardiopulmonary disease. Electronically Signed   By: Dwyane DeePaul  Barry M.D.   On: 07/14/2017 16:39    Procedures Procedures (including critical care time)  Medications Ordered in ED Medications  cefTRIAXone (ROCEPHIN) injection 250 mg (not administered)  azithromycin (ZITHROMAX) tablet 1,000 mg (not administered)     Initial Impression / Assessment and Plan / ED Course  I have reviewed the triage vital signs and the nursing notes.  Pertinent labs & imaging results that were available during my care of the patient were reviewed by me and considered in my medical decision making (see chart for details).     Final Clinical Impressions(s) / ED Diagnoses   Final diagnoses:  Chest pain, unspecified type  STD exposure    Labs:   Imaging: DG chest 2 view  Consults:  Therapeutics: Azithromycin, ceftriaxone  Discharge Meds:   Assessment/Plan:  26 year old male presents today with several complaints. Patient reports chest pain. This is likely anxiety induced, also induced by smoking marijuana. I counseled him on discontinuation of marijuana, he'll be given an inhaler as this potentially could be bronchospasms. Patient also requesting treatment for STDs. Low suspicion for ACS or PE in this patient. Patient does not want any testing, is just requesting treatment. He refuses testing for both cardiac and STDs. Patient reports he'll follow-up as an outpatient for HIV and syphilis testing.   New Prescriptions New Prescriptions   ALBUTEROL (PROVENTIL HFA;VENTOLIN HFA) 108 (90 BASE) MCG/ACT INHALER    Inhale 1-2 puffs into the lungs every 6 (six) hours as needed for wheezing or shortness of breath.     Eyvonne MechanicHedges, Christpher Stogsdill, PA-C 07/14/17  Ayesha [id number]    Shaune PollackIsaacs, Cameron, MD 07/15/17 [phone]

## 2017-07-14 NOTE — ED Notes (Signed)
Declined W/C at D/C and was escorted to lobby by RN. 

## 2018-08-07 ENCOUNTER — Encounter (HOSPITAL_BASED_OUTPATIENT_CLINIC_OR_DEPARTMENT_OTHER): Payer: Self-pay

## 2018-08-07 ENCOUNTER — Emergency Department (HOSPITAL_BASED_OUTPATIENT_CLINIC_OR_DEPARTMENT_OTHER)
Admission: EM | Admit: 2018-08-07 | Discharge: 2018-08-07 | Disposition: A | Discharge status: Home / Self Care | Payer: Self-pay | Attending: Emergency Medicine | Admitting: Emergency Medicine

## 2018-08-07 DIAGNOSIS — R600 Localized edema: Secondary | ICD-10-CM | POA: Insufficient documentation

## 2018-08-07 DIAGNOSIS — Z87891 Personal history of nicotine dependence: Secondary | ICD-10-CM | POA: Insufficient documentation

## 2018-08-07 DIAGNOSIS — K0889 Other specified disorders of teeth and supporting structures: Secondary | ICD-10-CM | POA: Insufficient documentation

## 2018-08-07 DIAGNOSIS — F1210 Cannabis abuse, uncomplicated: Secondary | ICD-10-CM | POA: Insufficient documentation

## 2018-08-07 MED ORDER — KETOROLAC TROMETHAMINE 15 MG/ML IJ SOLN
15.0000 mg | Freq: Once | INTRAMUSCULAR | Status: AC
  Administered 2018-08-07: 15 mg via INTRAMUSCULAR
  Filled 2018-08-07: qty 1

## 2018-08-07 MED ORDER — PENICILLIN V POTASSIUM 500 MG PO TABS: 500.0000 mg | ORAL_TABLET | Freq: Four times a day (QID) | ORAL | 0 refills | Status: AC

## 2018-08-07 MED FILL — PENICILLIN VK 500 MG TABLET: 500 | 7 days supply | Qty: 28 | Fill #0

## 2018-08-07 NOTE — Discharge Instructions (Addendum)

## 2018-08-07 NOTE — ED Provider Notes (Signed)
[facility] EMERGENCY DEPARTMENT Provider Note   CSN: [id number] Arrival date & time: 08/07/18  1014     History   Chief Complaint Chief Complaint  Patient presents with  . Dental Pain    HPI Christian Beltran is a 27 y.o. male.  HPI 27 year old African-American male with no pertinent past medical history presents to the ED for evaluation of dental pain and facial swelling.  Patient has a known bad tooth the left lower molar.  States that he woke this morning with swelling to his left lower face.  He reports having some pain over the past several weeks.  History of abscess with antibiotics.  He has never followed up with a dentist concerning this.  Denies any difficulties breathing or swallowing.  Denies any fevers or chills.  He has not taken any for the pain today prior to arrival.  Nothing makes better or worse. Past Medical History:  Diagnosis Date  . Attention deficit disorder   . Chlamydia   . STD (male)     There are no active problems to display for this patient.   History reviewed. No pertinent surgical history.      Home Medications    Prior to Admission medications   Medication Sig Start Date End Date Taking? Authorizing Provider  albuterol (PROVENTIL HFA;VENTOLIN HFA) 108 (90 Base) MCG/ACT inhaler Inhale 1-2 puffs into the lungs every 6 (six) hours as needed for wheezing or shortness of breath. 07/14/17   Hedges, Tinnie Gens, PA-C  amoxicillin-clavulanate (AUGMENTIN) 875-125 MG tablet Take 1 tablet by mouth every 12 (twelve) hours. 11/17/16   Tilden Fossa, MD  Aspirin-Salicylamide-Caffeine [facility] HEADACHE POWDER PO) Take 1 packet by mouth every 6 (six) hours as needed. Pain    [provider]  HYDROcodone-acetaminophen (NORCO/VICODIN) 5-325 MG tablet Take 1 tablet by mouth every 6 (six) hours as needed. 11/17/16   Tilden Fossa, MD  ibuprofen (ADVIL,MOTRIN) 600 MG tablet Take 1 tablet (600 mg total) by mouth every 6 (six) hours as needed. 10/26/16    Nira Conn, MD  penicillin v potassium (VEETID) 500 MG tablet Take 1 tablet (500 mg total) by mouth 4 (four) times daily for 7 days. 08/07/18 08/14/18  Rise Mu, PA-C    Family History Family History  Problem Relation Age of Onset  . Cancer Other   . Rheum arthritis Other     Social History Social History   Tobacco Use  . Smoking status: Former Smoker    Last attempt to quit: 12/26/2015    Years since quitting: 2.6  . Smokeless tobacco: Never Used  Substance Use Topics  . Alcohol use: Yes    Alcohol/week: 1.0 standard drinks    Types: 1 Cans of beer per week    Comment: 1 beer daily  . Drug use: Yes    Types: Marijuana     Allergies   Patient has no known allergies.   Review of Systems Review of Systems  Constitutional: Negative for chills and fever.  HENT: Positive for dental problem and facial swelling. Negative for trouble swallowing.   Respiratory: Negative for shortness of breath.   Gastrointestinal: Negative for vomiting.  Musculoskeletal: Negative for myalgias.     Physical Exam Updated Vital Signs BP (!) 134/91 (BP Location: Left Arm)   Pulse 66   Temp 98.2 F (36.8 C) (Oral)   Resp 16   Ht 6\' 1"  (1.854 m)   Wt 70.3 kg   SpO2 100%   BMI 20.45 kg/m  Physical Exam  Constitutional: He appears well-developed and well-nourished. No distress.  HENT:  Head: Normocephalic and atraumatic.  Mouth/Throat:    No sublingual or submandibular swelling.  Does have mild left-sided facial swelling without any erythema.  Mildly tender to palpation.  Oropharynx is clear.  Uvula is midline.  There is no muffled voice.  Managing secretions tolerating his airway.  Eyes: Right eye exhibits no discharge. Left eye exhibits no discharge. No scleral icterus.  Neck: Normal range of motion. Neck supple.  Pulmonary/Chest: No respiratory distress.  Musculoskeletal: Normal range of motion.  Lymphadenopathy:    He has no cervical adenopathy.    Neurological: He is alert.  Skin: Skin is warm and dry. Capillary refill takes less than 2 seconds. No pallor.  Psychiatric: His behavior is normal. Judgment and thought content normal.  Nursing note and vitals reviewed.    ED Treatments / Results  Labs (all labs ordered are listed, but only abnormal results are displayed) Labs Reviewed - No data to display  EKG None  Radiology No results found.  Procedures Procedures (including critical care time)  Medications Ordered in ED Medications  ketorolac (TORADOL) 15 MG/ML injection 15 mg (has no administration in time range)     Initial Impression / Assessment and Plan / ED Course  I have reviewed the triage vital signs and the nursing notes.  Pertinent labs & imaging results that were available during my care of the patient were reviewed by me and considered in my medical decision making (see chart for details).     Patient with toothache.  No gross abscess.  Exam unconcerning for Ludwig's angina or spread of infection.  Will treat with penicillin and pain medicine.  Urged patient to follow-up with dentist.    Pt is hemodynamically stable, in NAD, & able to ambulate in the ED. Evaluation does not show pathology that would require ongoing emergent intervention or inpatient treatment. I explained the diagnosis to the patient. Pain has been managed & has no complaints prior to dc. Pt is comfortable with above plan and is stable for discharge at this time. All questions were answered prior to disposition. Strict return precautions for f/u to the ED were discussed. Encouraged follow up with PCP.    Final Clinical Impressions(s) / ED Diagnoses   Final diagnoses:  Pain, dental    ED Discharge Orders         Ordered    penicillin v potassium (VEETID) 500 MG tablet  4 times daily     08/07/18 1258           Rise Mu, PA-C 08/07/18 1301    Sabas Sous, MD 08/07/18 1725

## 2018-08-07 NOTE — ED Triage Notes (Signed)
Pt c/o toothache, lt lower abscess to bad tooth, swelling noted

## 2018-09-14 ENCOUNTER — Encounter (HOSPITAL_BASED_OUTPATIENT_CLINIC_OR_DEPARTMENT_OTHER): Payer: Self-pay

## 2018-09-14 ENCOUNTER — Other Ambulatory Visit: Payer: Self-pay

## 2018-09-14 ENCOUNTER — Emergency Department (HOSPITAL_BASED_OUTPATIENT_CLINIC_OR_DEPARTMENT_OTHER)
Admission: EM | Admit: 2018-09-14 | Discharge: 2018-09-14 | Disposition: A | Discharge status: Home / Self Care | Payer: Self-pay | Attending: Emergency Medicine | Admitting: Emergency Medicine

## 2018-09-14 DIAGNOSIS — R369 Urethral discharge, unspecified: Secondary | ICD-10-CM | POA: Insufficient documentation

## 2018-09-14 DIAGNOSIS — Z202 Contact with and (suspected) exposure to infections with a predominantly sexual mode of transmission: Secondary | ICD-10-CM | POA: Insufficient documentation

## 2018-09-14 LAB — URINALYSIS, ROUTINE W REFLEX MICROSCOPIC
Bilirubin Urine: NEGATIVE
GLUCOSE, UA: NEGATIVE mg/dL
HGB URINE DIPSTICK: NEGATIVE
Ketones, ur: NEGATIVE mg/dL
Leukocytes, UA: NEGATIVE
Nitrite: NEGATIVE
PH: 7.5 (ref 5.0–8.0)
Protein, ur: NEGATIVE mg/dL
SPECIFIC GRAVITY, URINE: 1.015 (ref 1.005–1.030)

## 2018-09-14 MED ORDER — CEFTRIAXONE SODIUM 250 MG IJ SOLR
250.0000 mg | Freq: Once | INTRAMUSCULAR | Status: AC
  Administered 2018-09-14: 250 mg via INTRAMUSCULAR
  Filled 2018-09-14: qty 250

## 2018-09-14 MED ORDER — AZITHROMYCIN 250 MG PO TABS
1000.0000 mg | ORAL_TABLET | Freq: Once | ORAL | Status: AC
  Administered 2018-09-14: 1000 mg via ORAL
  Filled 2018-09-14: qty 4

## 2018-09-14 NOTE — ED Provider Notes (Signed)
[facility] EMERGENCY DEPARTMENT Provider Note   CSN: [id number] Arrival date & time: 09/14/18  1342     History   Chief Complaint Chief Complaint  Patient presents with  . Exposure to STD    HPI Christian Beltran is a 27 y.o. male presenting today for concern of penile discharge and STD exposure.  Patient states that he has noticed a thin yellow discharge from the to his penis that only occurs after he urinates for the past 2 days, denies associated dysuria/hematuria or pain.  Patient also informed by his male partner that she tested positive for chlamydia yesterday and encouraged him to seek testing and treatment.  Patient denies testicular pain/swelling, abdominal pain, fever, nausea/vomiting, rash/sores or any other concerns aside from penile discharge today.  Patient states that he was recently seen at the health department for routine blood work including HIV and syphilis 2 weeks ago and is still awaiting these results.  HPI  Past Medical History:  Diagnosis Date  . Attention deficit disorder   . Chlamydia   . STD (male)     There are no active problems to display for this patient.   History reviewed. No pertinent surgical history.      Home Medications    Prior to Admission medications   Medication Sig Start Date End Date Taking? Authorizing Provider  albuterol (PROVENTIL HFA;VENTOLIN HFA) 108 (90 Base) MCG/ACT inhaler Inhale 1-2 puffs into the lungs every 6 (six) hours as needed for wheezing or shortness of breath. 07/14/17   Hedges, Tinnie Gens, PA-C  Aspirin-Salicylamide-Caffeine (BC HEADACHE POWDER PO) Take 1 packet by mouth every 6 (six) hours as needed. Pain    [provider]  ibuprofen (ADVIL,MOTRIN) 600 MG tablet Take 1 tablet (600 mg total) by mouth every 6 (six) hours as needed. 10/26/16   Nira Conn, MD    Family History Family History  Problem Relation Age of Onset  . Cancer Other   . Rheum arthritis Other     Social  History Social History   Tobacco Use  . Smoking status: Never Smoker  . Smokeless tobacco: Never Used  Substance Use Topics  . Alcohol use: Yes    Alcohol/week: 1.0 standard drinks    Types: 1 Cans of beer per week    Comment: daily  . Drug use: Yes    Types: Marijuana     Allergies   Patient has no known allergies.   Review of Systems Review of Systems  Constitutional: Negative.  Negative for chills and fever.  Gastrointestinal: Negative.  Negative for abdominal pain, nausea and vomiting.  Genitourinary: Positive for discharge. Negative for dysuria, flank pain, genital sores, hematuria, penile pain, penile swelling, scrotal swelling and testicular pain.  Musculoskeletal: Negative.  Negative for arthralgias and myalgias.  Skin: Negative.  Negative for rash.  Neurological: Negative.  Negative for headaches.   Physical Exam Updated Vital Signs BP (!) 136/97 (BP Location: Left Arm)   Pulse 77   Temp 98.7 F (37.1 C) (Oral)   Resp 18   Ht 6\' 2"  (1.88 m)   Wt 67.6 kg   SpO2 100%   BMI 19.13 kg/m   Physical Exam  Constitutional: He appears well-developed and well-nourished. No distress.  HENT:  Head: Normocephalic and atraumatic.  Right Ear: External ear normal.  Left Ear: External ear normal.  Nose: Nose normal.  Eyes: Pupils are equal, round, and reactive to light. EOM are normal.  Neck: Trachea normal and normal range of motion.  No tracheal deviation present.  Pulmonary/Chest: Effort normal. No respiratory distress.  Abdominal: Soft. There is no tenderness. There is no rebound and no guarding.  Genitourinary:  Genitourinary Comments: Chaperone present during genital exam Environmental education officer.  No external genital lesions noted, no bumps on head of penis, specifically no vesicles concerning for herpes or chancre suggestive of syphilis.  No pain with palpation of the penis/glans, no discharge or urethritis noted.  Scrotum and testicles without erythema/swelling or tenderness  to palpation. Cremasteric reflex intact bilaterally. No palpable hernia noted.   Musculoskeletal: Normal range of motion.  Neurological: He is alert. GCS eye subscore is 4. GCS verbal subscore is 5. GCS motor subscore is 6.  Speech is clear and goal oriented, follows commands Major Cranial nerves without deficit, no facial droop Moves extremities without ataxia, coordination intact Normal gait  Skin: Skin is warm and dry.  Psychiatric: He has a normal mood and affect. His behavior is normal.   ED Treatments / Results  Labs (all labs ordered are listed, but only abnormal results are displayed) Labs Reviewed  URINALYSIS, ROUTINE W REFLEX MICROSCOPIC  GC/CHLAMYDIA PROBE AMP ([place]) NOT AT [facility]    EKG None  Radiology No results found.  Procedures Procedures (including critical care time)  Medications Ordered in ED Medications  cefTRIAXone (ROCEPHIN) injection 250 mg (250 mg Intramuscular Given 09/14/18 1411)  azithromycin (ZITHROMAX) tablet 1,000 mg (1,000 mg Oral Given 09/14/18 1411)     Initial Impression / Assessment and Plan / ED Course  I have reviewed the triage vital signs and the nursing notes.  Pertinent labs & imaging results that were available during my care of the patient were reviewed by me and considered in my medical decision making (see chart for details).  Clinical Course as of Sep 15 1451  Thu Sep 14, 2018  1403 GU exam chaperoned by Janeece Riggers.   [BM]    Clinical Course User Index [BM] Bill Salinas, PA-C   27 year old male presenting for concern of penile discharge and STD exposure from his male partner.  Patient treated in the ED for STI with rocephin and azithromycin. Patient advised to inform and treat all sexual partners.  Pt advised on safe sex practices and understands that they have GC/Chlamydia cultures pending and will result in 2-3 days.  Patient reports that he was recently tested by the health department for HIV/syphilis and  these results are pending, does not wish to be tested today.. Patient encouraged to follow up at local health department for future STI checks. No concern for prostatitis or epididymitis at this time.  Afebrile, not tachycardic, not hypotensive well-appearing and in no acute distress.  At this time there does not appear to be any evidence of an acute emergency medical condition and the patient appears stable for discharge with appropriate outpatient follow up. Diagnosis was discussed with patient who verbalizes understanding of care plan and is agreeable to discharge. I have discussed return precautions with patient and who verbalizes understanding of return precautions. Patient strongly encouraged to follow-up with their PCP. All questions answered.   Note: Portions of this report may have been transcribed using voice recognition software. Every effort was made to ensure accuracy; however, inadvertent computerized transcription errors may still be present.  Final Clinical Impressions(s) / ED Diagnoses   Final diagnoses:  STD exposure    ED Discharge Orders    None       Bill Salinas, PA-C 09/14/18 1453    Long,  Arlyss Repress, MD 09/14/18 [phone]

## 2018-09-14 NOTE — ED Triage Notes (Signed)
Pt reports a STD exposure and penile d/c-NAD-steady gait

## 2018-09-14 NOTE — Discharge Instructions (Signed)
You have been diagnosed today with sexually transmitted disease exposure.  At this time there does not appear to be the presence of an emergent medical condition, however there is always the potential for conditions to worsen. Please read and follow the below instructions.  Please return to the Emergency Department immediately for any new or worsening symptoms or if your symptoms do not improve. Please be sure to follow up with your Primary Care Provider as soon as possible regarding your visit today; please call their office to schedule an appointment even if you are feeling better for a follow-up visit. You have been treated today for presumed gonorrhea/chlamydia.  These swabs have been sent off and take 2-3 days to result.  You will be contacted by [facility] for positive results only.  For negative results please check your MyChart account.  Please abstain from sexual intercourse for the next 2 weeks and be sure that all of your partners are tested and treated before resuming sexual activity.  You may follow-up with the [facility] department for future STD testing.  Get help right away if: You have severe abdominal pain. You have a fever You have nausea/vomiting You are a man and notice swelling or pain in your testicles. You are a woman and notice swelling or pain in your vag  Please read the additional information packets attached to your discharge summary.  Do not take your medicine if  develop an itchy rash, swelling in your mouth or lips, or difficulty breathing.

## 2018-09-15 LAB — GC/CHLAMYDIA PROBE AMP (~~LOC~~) NOT AT ARMC
CHLAMYDIA, DNA PROBE: POSITIVE — AB
NEISSERIA GONORRHEA: NEGATIVE

## 2019-10-01 ENCOUNTER — Other Ambulatory Visit: Payer: Self-pay

## 2019-10-01 ENCOUNTER — Encounter (HOSPITAL_BASED_OUTPATIENT_CLINIC_OR_DEPARTMENT_OTHER): Payer: Self-pay

## 2019-10-01 ENCOUNTER — Emergency Department (HOSPITAL_BASED_OUTPATIENT_CLINIC_OR_DEPARTMENT_OTHER)
Admission: EM | Admit: 2019-10-01 | Discharge: 2019-10-01 | Disposition: A | Discharge status: Home / Self Care | Payer: Self-pay | Attending: Emergency Medicine | Admitting: Emergency Medicine

## 2019-10-01 DIAGNOSIS — Z711 Person with feared health complaint in whom no diagnosis is made: Secondary | ICD-10-CM | POA: Insufficient documentation

## 2019-10-01 DIAGNOSIS — R109 Unspecified abdominal pain: Secondary | ICD-10-CM | POA: Insufficient documentation

## 2019-10-01 DIAGNOSIS — N4889 Other specified disorders of penis: Secondary | ICD-10-CM | POA: Insufficient documentation

## 2019-10-01 LAB — URINALYSIS, ROUTINE W REFLEX MICROSCOPIC
Bilirubin Urine: NEGATIVE
Glucose, UA: NEGATIVE mg/dL
Hgb urine dipstick: NEGATIVE
Ketones, ur: NEGATIVE mg/dL
Leukocytes,Ua: NEGATIVE
Nitrite: NEGATIVE
Protein, ur: NEGATIVE mg/dL
Specific Gravity, Urine: 1.02 (ref 1.005–1.030)
pH: 8 (ref 5.0–8.0)

## 2019-10-01 MED ORDER — AZITHROMYCIN 250 MG PO TABS
1000.0000 mg | ORAL_TABLET | Freq: Once | ORAL | Status: AC
  Administered 2019-10-01: 1000 mg via ORAL
  Filled 2019-10-01: qty 4

## 2019-10-01 MED ORDER — CEFTRIAXONE SODIUM 250 MG IJ SOLR
250.0000 mg | Freq: Once | INTRAMUSCULAR | Status: AC
  Administered 2019-10-01: 250 mg via INTRAMUSCULAR
  Filled 2019-10-01: qty 250

## 2019-10-01 NOTE — ED Provider Notes (Signed)
[place] EMERGENCY DEPARTMENT Provider Note   CSN: [id number] Arrival date & time: 10/01/19  1021     History   Chief Complaint Chief Complaint  Patient presents with  . Abdominal Pain  . SEXUALLY TRANSMITTED DISEASE    HPI Christian Beltran is a 28 y.o. male with prior history of chlamydia presents for evaluation of acute onset, persistent penile irritation for a few days.  Also reports very mild aching to the right side of the low back.  He states that the last time he felt this way he had an STD and he states he feels confident that he has an STD today.  He has had 2 male partners over the last 6 months, has not always use protection.  Denies nausea, vomiting, abdominal pain, fevers, pain with bowel movements, testicular pain or scrotal swelling, genital lesions, urethral drainage, or dysuria.  Declines testing for HIV or syphilis.    The history is provided by the patient.    Past Medical History:  Diagnosis Date  . Attention deficit disorder   . Chlamydia   . STD (male)     There are no active problems to display for this patient.   History reviewed. No pertinent surgical history.      Home Medications    Prior to Admission medications   Medication Sig Start Date End Date Taking? Authorizing Provider  albuterol (PROVENTIL HFA;VENTOLIN HFA) 108 (90 Base) MCG/ACT inhaler Inhale 1-2 puffs into the lungs every 6 (six) hours as needed for wheezing or shortness of breath. 07/14/17   Hedges, Dellis Filbert, PA-C  Aspirin-Salicylamide-Caffeine (BC HEADACHE POWDER PO) Take 1 packet by mouth every 6 (six) hours as needed. Pain    [provider]  ibuprofen (ADVIL,MOTRIN) 600 MG tablet Take 1 tablet (600 mg total) by mouth every 6 (six) hours as needed. 10/26/16   Fatima Blank, MD    Family History Family History  Problem Relation Age of Onset  . Cancer Other   . Rheum arthritis Other     Social History Social History   Tobacco Use  .  Smoking status: Never Smoker  . Smokeless tobacco: Never Used  Substance Use Topics  . Alcohol use: Yes    Alcohol/week: 1.0 standard drinks    Types: 1 Cans of beer per week    Comment: daily  . Drug use: Yes    Types: Marijuana     Allergies   Patient has no known allergies.   Review of Systems Review of Systems  Constitutional: Negative for chills and fever.  Respiratory: Negative for shortness of breath.   Cardiovascular: Negative for chest pain.  Gastrointestinal: Negative for abdominal pain, nausea and vomiting.  Genitourinary: Positive for flank pain. Negative for dysuria, frequency, penile swelling, scrotal swelling and testicular pain.  All other systems reviewed and are negative.    Physical Exam Updated Vital Signs BP (!) 141/107 (BP Location: Right Arm)   Pulse 73   Temp 98.9 F (37.2 C) (Oral)   Resp 17   Ht 6\' 2"  (1.88 m)   Wt 77.1 kg   SpO2 100%   BMI 21.83 kg/m   Physical Exam Vitals signs and nursing note reviewed. Exam conducted with a chaperone present.  Constitutional:      General: He is not in acute distress.    Appearance: He is well-developed.     Comments: Resting comfortably in chair  HENT:     Head: Normocephalic and atraumatic.  Eyes:  General:        Right eye: No discharge.        Left eye: No discharge.     Conjunctiva/sclera: Conjunctivae normal.  Neck:     Vascular: No JVD.     Trachea: No tracheal deviation.  Cardiovascular:     Rate and Rhythm: Normal rate.  Pulmonary:     Effort: Pulmonary effort is normal.  Abdominal:     General: Abdomen is flat. Bowel sounds are normal. There is no distension.     Palpations: Abdomen is soft.     Tenderness: There is no abdominal tenderness. There is no right CVA tenderness or left CVA tenderness.  Genitourinary:    Penis: Normal and circumcised. No erythema, discharge, swelling or lesions.      Scrotum/Testes: Normal.     Epididymis:     Right: Normal.     Left: Normal.   Musculoskeletal:     Comments: No midline lumbar spine tenderness, no tenderness to palpation of the paralumbar musculature.  No deformity, crepitus, or step-off noted.  Lymphadenopathy:     Lower Body: No right inguinal adenopathy. No left inguinal adenopathy.  Skin:    General: Skin is warm and dry.     Findings: No erythema.  Neurological:     Mental Status: He is alert.  Psychiatric:        Behavior: Behavior normal.      ED Treatments / Results  Labs (all labs ordered are listed, but only abnormal results are displayed) Labs Reviewed  URINALYSIS, ROUTINE W REFLEX MICROSCOPIC  GC/CHLAMYDIA PROBE AMP ([place]) NOT AT [facility]    EKG None  Radiology No results found.  Procedures Procedures (including critical care time)  Medications Ordered in ED Medications  cefTRIAXone (ROCEPHIN) injection 250 mg (has no administration in time range)  azithromycin (ZITHROMAX) tablet 1,000 mg (has no administration in time range)     Initial Impression / Assessment and Plan / ED Course  I have reviewed the triage vital signs and the nursing notes.  Pertinent labs & imaging results that were available during my care of the patient were reviewed by me and considered in my medical decision making (see chart for details).        Patient presenting with concern for STD.  He is complaining of some penile irritation and right flank pain which is not reproducible on palpation.  No midline spine tenderness, no red flag signs concerning for cauda equina or spinal abscess.  He is afebrile, vital signs are stable.  He is nontoxic in appearance.  He tells me he feels the same as he has felt previously when he has contracted an STD.  He does not think he has a kidney stone.  Abdomen soft and nontender.  Declines HIV or syphilis testing but gonorrhea and Chlamydia cultures were obtained. No painful bowel movements to indicate prostatitis.  No tenderness to palpation of the testes or epididymis to  suggest orchitis or epididymitis.  Patient to be discharged with instructions to follow up with PCP. Discussed importance of using protection when sexually active. Pt understands that they have GC/Chlamydia cultures pending and that they will need to inform all sexual partners if results return positive. Patient has been treated prophylactically with azithromycin and Rocephin.     Final Clinical Impressions(s) / ED Diagnoses   Final diagnoses:  Penile irritation  Left flank pain  Concern about STD in male without diagnosis    ED Discharge Orders  None       Bennye Alm 10/01/19 1247    Jacalyn Lefevre, MD 10/01/19 1248

## 2019-10-01 NOTE — ED Triage Notes (Signed)
Pt reports that he would like to be swabbed for STD reporting that he has had one in the past and this feels similar with c/o penile "dryness" and abdominal pain

## 2019-10-01 NOTE — Discharge Instructions (Addendum)
Follow up with [facility]D clinic to be screened for HIV in the future and for future STD concerns or screenings. This is the recommendation by the CDC for people with multiple sexual partners or hx of STDs. You have been treated for gonorrhea and chlamydia in the ER but the hospital will call you if lab is positive.  Do not have sexual intercourse for 7 days after antibiotic treatment. ° °

## 2019-10-02 LAB — GC/CHLAMYDIA PROBE AMP (~~LOC~~) NOT AT ARMC
Chlamydia: NEGATIVE
Neisseria Gonorrhea: NEGATIVE

## 2020-01-15 ENCOUNTER — Emergency Department (HOSPITAL_BASED_OUTPATIENT_CLINIC_OR_DEPARTMENT_OTHER)
Admission: EM | Admit: 2020-01-15 | Discharge: 2020-01-15 | Disposition: A | Discharge status: Home / Self Care | Payer: Self-pay | Attending: Emergency Medicine | Admitting: Emergency Medicine

## 2020-01-15 ENCOUNTER — Other Ambulatory Visit: Payer: Self-pay

## 2020-01-15 ENCOUNTER — Encounter (HOSPITAL_BASED_OUTPATIENT_CLINIC_OR_DEPARTMENT_OTHER): Payer: Self-pay

## 2020-01-15 DIAGNOSIS — R1012 Left upper quadrant pain: Secondary | ICD-10-CM | POA: Insufficient documentation

## 2020-01-15 DIAGNOSIS — Z202 Contact with and (suspected) exposure to infections with a predominantly sexual mode of transmission: Secondary | ICD-10-CM | POA: Insufficient documentation

## 2020-01-15 DIAGNOSIS — Z711 Person with feared health complaint in whom no diagnosis is made: Secondary | ICD-10-CM

## 2020-01-15 DIAGNOSIS — R300 Dysuria: Secondary | ICD-10-CM | POA: Insufficient documentation

## 2020-01-15 LAB — URINALYSIS, ROUTINE W REFLEX MICROSCOPIC
Glucose, UA: NEGATIVE mg/dL
Hgb urine dipstick: NEGATIVE
Ketones, ur: 40 mg/dL — AB
Leukocytes,Ua: NEGATIVE
Nitrite: NEGATIVE
Protein, ur: NEGATIVE mg/dL
Specific Gravity, Urine: 1.02 (ref 1.005–1.030)
pH: 6.5 (ref 5.0–8.0)

## 2020-01-15 MED ORDER — CEFTRIAXONE SODIUM 500 MG IJ SOLR
500.0000 mg | Freq: Once | INTRAMUSCULAR | Status: AC
  Administered 2020-01-15: 500 mg via INTRAMUSCULAR
  Filled 2020-01-15: qty 500

## 2020-01-15 MED ORDER — LIDOCAINE HCL (PF) 1 % IJ SOLN
INTRAMUSCULAR | Status: AC
  Administered 2020-01-15: 13:00:00 5 mL
  Filled 2020-01-15: qty 5

## 2020-01-15 MED ORDER — DOXYCYCLINE HYCLATE 100 MG PO CAPS: 100.0000 mg | ORAL_CAPSULE | Freq: Two times a day (BID) | ORAL | 0 refills | Status: AC

## 2020-01-15 MED FILL — DOXYCYCLINE HYCLATE 100 MG: 100 | 7 days supply | Qty: 14 | Fill #0

## 2020-01-15 NOTE — ED Triage Notes (Signed)
Pt arrives with reports of frequent urination and exposure to STD states that someone called him to report that he had been exposed to gonorrhea chlamydia.

## 2020-01-15 NOTE — Discharge Instructions (Signed)
Please take all of your antibiotics until finished!   Take your antibiotics with food.  Common side effects of antibiotics include nausea, vomiting, abdominal discomfort, and diarrhea. You may help offset some of this with probiotics which you can buy or get in yogurt. Do not eat  or take the probiotics until 2 hours after your antibiotic.   Do not drink any alcohol while on these antibiotics  Follow up with [facility] STD clinic to be screened for HIV in the future and for future STD concerns or screenings. This is the recommendation by the CDC for people with multiple sexual partners or hx of STDs. You are being treated for gonorrhea and chlamydia in the ER but the hospital will call you if lab is positive.  Do not have sexual intercourse for 7 days after antibiotic treatment.

## 2020-01-15 NOTE — ED Provider Notes (Signed)
[place] EMERGENCY DEPARTMENT Provider Note   CSN: [id number] Arrival date & time: 01/15/20  1028     History Chief Complaint  Patient presents with  . SEXUALLY TRANSMITTED DISEASE    Christian Beltran is a 29 y.o. male with history of ADD presents for evaluation of acute onset, persistent urethral burning since last night.  Reports that he received a phone call today from a recent sexual partner that she had tested positive for gonorrhea and chlamydia.  He denies testicular pain, scrotal swelling, penile lesions, urethral drainage, fevers, or pain with bowel movements.  Reports that she is a new sexual partner but he has not had any other new partner since he was treated for STDs most recently.  He has had some mild cramping left upper quadrant abdominal pain but reports that this began after drinking a large quantity of alcohol.   The history is provided by the patient.       Past Medical History:  Diagnosis Date  . Attention deficit disorder   . Chlamydia   . STD (male)     There are no problems to display for this patient.   History reviewed. No pertinent surgical history.     Family History  Problem Relation Age of Onset  . Cancer Other   . Rheum arthritis Other     Social History   Tobacco Use  . Smoking status: Never Smoker  . Smokeless tobacco: Never Used  Substance Use Topics  . Alcohol use: Yes    Alcohol/week: 1.0 standard drinks    Types: 1 Cans of beer per week    Comment: daily  . Drug use: Yes    Types: Marijuana    Home Medications Prior to Admission medications   Medication Sig Start Date End Date Taking? Authorizing Provider  albuterol (PROVENTIL HFA;VENTOLIN HFA) 108 (90 Base) MCG/ACT inhaler Inhale 1-2 puffs into the lungs every 6 (six) hours as needed for wheezing or shortness of breath. 07/14/17   Hedges, Dellis Filbert, PA-C  Aspirin-Salicylamide-Caffeine (BC HEADACHE POWDER PO) Take 1 packet by mouth every 6 (six) hours as needed.  Pain    [provider]  doxycycline (VIBRAMYCIN) 100 MG capsule Take 1 capsule (100 mg total) by mouth 2 (two) times daily for 7 days. 01/15/20 01/22/20  Rodell Perna A, PA-C  ibuprofen (ADVIL,MOTRIN) 600 MG tablet Take 1 tablet (600 mg total) by mouth every 6 (six) hours as needed. 10/26/16   Fatima Blank, MD    Allergies    Patient has no known allergies.  Review of Systems   Review of Systems  Constitutional: Negative for fever.  Respiratory: Negative for shortness of breath.   Cardiovascular: Negative for chest pain.  Gastrointestinal: Positive for abdominal pain. Negative for nausea and vomiting.  Genitourinary: Positive for dysuria. Negative for discharge, genital sores, hematuria, penile pain, penile swelling, scrotal swelling and testicular pain.  All other systems reviewed and are negative.   Physical Exam Updated Vital Signs BP (!) 155/107 (BP Location: Right Arm)   Pulse 70   Temp 98.4 F (36.9 C) (Oral)   Resp 18   Ht 6\' 1"  (1.854 m)   Wt 69.4 kg   SpO2 100%   BMI 20.17 kg/m   Physical Exam Vitals and nursing note reviewed.  Constitutional:      General: He is not in acute distress.    Appearance: Normal appearance. He is well-developed.  HENT:     Head: Normocephalic and atraumatic.  Eyes:  General:        Right eye: No discharge.        Left eye: No discharge.     Conjunctiva/sclera: Conjunctivae normal.  Neck:     Vascular: No JVD.     Trachea: No tracheal deviation.  Cardiovascular:     Rate and Rhythm: Normal rate and regular rhythm.  Pulmonary:     Effort: Pulmonary effort is normal.     Breath sounds: Normal breath sounds.  Abdominal:     General: Abdomen is flat. Bowel sounds are normal. There is no distension.     Palpations: Abdomen is soft.     Tenderness: There is no abdominal tenderness. There is no right CVA tenderness, left CVA tenderness, guarding or rebound.     Comments: Very mild discomfort on palpation of the  left upper quadrant.  Genitourinary:    Comments: Patient refused. Musculoskeletal:     Cervical back: Neck supple.  Skin:    Findings: No erythema.  Neurological:     Mental Status: He is alert.  Psychiatric:        Behavior: Behavior normal.     ED Results / Procedures / Treatments   Labs (all labs ordered are listed, but only abnormal results are displayed) Labs Reviewed  URINALYSIS, ROUTINE W REFLEX MICROSCOPIC - Abnormal; Notable for the following components:      Result Value   Bilirubin Urine SMALL (*)    Ketones, ur 40 (*)    All other components within normal limits  GC/CHLAMYDIA PROBE AMP ([place]) NOT AT [facility]    EKG None  Radiology No results found.  Procedures Procedures (including critical care time)  Medications Ordered in ED Medications  cefTRIAXone (ROCEPHIN) injection 500 mg (500 mg Intramuscular Given 01/15/20 1241)  lidocaine (PF) (XYLOCAINE) 1 % injection (5 mLs  Given 01/15/20 1242)    ED Course  I have reviewed the triage vital signs and the nursing notes.  Pertinent labs & imaging results that were available during my care of the patient were reviewed by me and considered in my medical decision making (see chart for details).    MDM Rules/Calculators/A&P                      Patient presenting for evaluation after exposure to STD.  Has some urethral irritation per his report but refuses GU examination.  He understands the risks of refusal but reports he does not have any serious or concerning symptoms.  Abdomen is soft with no peritoneal signs but he has some mild left upper quadrant discomfort on palpation but reports symptoms began after drinking large quantities of alcohol.  I encouraged him to drink in moderation and suspect that he might have some mild gastritis.  Has had no fevers, nausea, or vomiting.  He has no lower abdominal pain.  Doubt epididymitis, orchitis, or testicular torsion.  Doubt prostatitis or proctitis.  Will treat for  gonorrhea and chlamydia infection.  Recommend follow-up with health department for any future STD testing or treatment.  Discussed strict ED return precautions. Patient verbalized understanding of and agreement with plan and is safe for discharge home at this time.   Final Clinical Impression(s) / ED Diagnoses Final diagnoses:  Concern about STD in male without diagnosis    Rx / DC Orders ED Discharge Orders         Ordered    doxycycline (VIBRAMYCIN) 100 MG capsule  2 times daily  01/15/20 1253           Jeanie Sewer, PA-C 01/15/20 1256    Terald Sleeper, MD 01/15/20 Corky Crafts

## 2020-01-16 LAB — GC/CHLAMYDIA PROBE AMP (~~LOC~~) NOT AT ARMC
Chlamydia: NEGATIVE
Neisseria Gonorrhea: NEGATIVE

## 2020-09-16 ENCOUNTER — Emergency Department (HOSPITAL_BASED_OUTPATIENT_CLINIC_OR_DEPARTMENT_OTHER)
Admission: EM | Admit: 2020-09-16 | Discharge: 2020-09-16 | Disposition: A | Discharge status: Home / Self Care | Payer: No Typology Code available for payment source | Attending: Emergency Medicine | Admitting: Emergency Medicine

## 2020-09-16 ENCOUNTER — Emergency Department (HOSPITAL_BASED_OUTPATIENT_CLINIC_OR_DEPARTMENT_OTHER): Payer: No Typology Code available for payment source

## 2020-09-16 ENCOUNTER — Other Ambulatory Visit: Payer: Self-pay

## 2020-09-16 ENCOUNTER — Encounter (HOSPITAL_BASED_OUTPATIENT_CLINIC_OR_DEPARTMENT_OTHER): Payer: Self-pay | Admitting: *Deleted

## 2020-09-16 DIAGNOSIS — Z202 Contact with and (suspected) exposure to infections with a predominantly sexual mode of transmission: Secondary | ICD-10-CM | POA: Insufficient documentation

## 2020-09-16 DIAGNOSIS — S4992XA Unspecified injury of left shoulder and upper arm, initial encounter: Secondary | ICD-10-CM | POA: Diagnosis present

## 2020-09-16 DIAGNOSIS — Z7982 Long term (current) use of aspirin: Secondary | ICD-10-CM | POA: Diagnosis not present

## 2020-09-16 DIAGNOSIS — Y92410 Unspecified street and highway as the place of occurrence of the external cause: Secondary | ICD-10-CM | POA: Diagnosis not present

## 2020-09-16 DIAGNOSIS — Y9389 Activity, other specified: Secondary | ICD-10-CM | POA: Insufficient documentation

## 2020-09-16 DIAGNOSIS — S46912A Strain of unspecified muscle, fascia and tendon at shoulder and upper arm level, left arm, initial encounter: Secondary | ICD-10-CM | POA: Insufficient documentation

## 2020-09-16 MED ORDER — KETOROLAC TROMETHAMINE 60 MG/2ML IM SOLN
60.0000 mg | Freq: Once | INTRAMUSCULAR | Status: AC
  Administered 2020-09-16: 60 mg via INTRAMUSCULAR
  Filled 2020-09-16: qty 2

## 2020-09-16 MED ORDER — AZITHROMYCIN 250 MG PO TABS
1000.0000 mg | ORAL_TABLET | Freq: Once | ORAL | Status: AC
  Administered 2020-09-16: 1000 mg via ORAL
  Filled 2020-09-16: qty 4

## 2020-09-16 MED ORDER — CYCLOBENZAPRINE HCL 10 MG PO TABS: 10.0000 mg | ORAL_TABLET | Freq: Two times a day (BID) | ORAL | 0 refills | Status: AC | PRN
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
## 2020-09-16 NOTE — ED Notes (Signed)
Patient transported to X-ray 

## 2020-09-16 NOTE — ED Notes (Signed)
Pt ambulatory with steady gait to restroom to provide urine specimen 

## 2020-09-17 LAB — GC/CHLAMYDIA PROBE AMP (~~LOC~~) NOT AT ARMC
Chlamydia: NEGATIVE
Comment: NEGATIVE
Comment: NORMAL
Neisseria Gonorrhea: NEGATIVE

## 2020-10-22 ENCOUNTER — Emergency Department (HOSPITAL_BASED_OUTPATIENT_CLINIC_OR_DEPARTMENT_OTHER)
Admission: EM | Admit: 2020-10-22 | Discharge: 2020-10-22 | Disposition: A | Discharge status: Home / Self Care | Payer: Self-pay | Attending: Emergency Medicine | Admitting: Emergency Medicine

## 2020-10-22 ENCOUNTER — Other Ambulatory Visit (HOSPITAL_BASED_OUTPATIENT_CLINIC_OR_DEPARTMENT_OTHER): Payer: Self-pay | Admitting: Physician Assistant

## 2020-10-22 ENCOUNTER — Other Ambulatory Visit: Payer: Self-pay

## 2020-10-22 ENCOUNTER — Encounter (HOSPITAL_BASED_OUTPATIENT_CLINIC_OR_DEPARTMENT_OTHER): Payer: Self-pay

## 2020-10-22 DIAGNOSIS — Z202 Contact with and (suspected) exposure to infections with a predominantly sexual mode of transmission: Secondary | ICD-10-CM | POA: Insufficient documentation

## 2020-10-22 DIAGNOSIS — R1032 Left lower quadrant pain: Secondary | ICD-10-CM | POA: Insufficient documentation

## 2020-10-22 DIAGNOSIS — M549 Dorsalgia, unspecified: Secondary | ICD-10-CM | POA: Insufficient documentation

## 2020-10-22 DIAGNOSIS — Z711 Person with feared health complaint in whom no diagnosis is made: Secondary | ICD-10-CM

## 2020-10-22 LAB — URINALYSIS, ROUTINE W REFLEX MICROSCOPIC
Bilirubin Urine: NEGATIVE
Glucose, UA: NEGATIVE mg/dL
Hgb urine dipstick: NEGATIVE
Ketones, ur: NEGATIVE mg/dL
Leukocytes,Ua: NEGATIVE
Nitrite: NEGATIVE
Protein, ur: NEGATIVE mg/dL
Specific Gravity, Urine: 1.015 (ref 1.005–1.030)
pH: 7.5 (ref 5.0–8.0)

## 2020-10-22 MED ORDER — DOXYCYCLINE HYCLATE 100 MG PO CAPS: 100.0000 mg | ORAL_CAPSULE | Freq: Two times a day (BID) | ORAL | 0 refills | Status: DC

## 2020-10-22 MED ORDER — METRONIDAZOLE 500 MG PO TABS
2000.0000 mg | ORAL_TABLET | Freq: Once | ORAL | Status: DC
Start: 2020-10-22 — End: 2020-10-22

## 2020-10-22 MED FILL — DOXYCYCLINE HYCLATE 100 MG: 100 | 7 days supply | Qty: 14 | Fill #0

## 2020-10-22 NOTE — ED Notes (Signed)
Review D/C papers with pt, pt states understanding, pt denies questions at this time. 
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
## 2020-10-22 NOTE — Discharge Instructions (Addendum)
Your tested for gonorrhea and chlamydia here today.  These results will return in several days, they will be available on the MyChart app.  There is instructions on your discharge paperwork on how to download this app if you do not have it.  You were also treated for chlamydia today.  Please take the antibiotic as directed until finished.  If your symptoms continue, please follow-up with [facility] which is a free clinic in the area, or the Department of Health whose contact information provided in your discharge paperwork.  Turn to the ER for any new or worsening symptoms.

## 2020-10-23 LAB — GC/CHLAMYDIA PROBE AMP (~~LOC~~) NOT AT ARMC
Chlamydia: NEGATIVE
Comment: NEGATIVE
Comment: NORMAL
Neisseria Gonorrhea: NEGATIVE
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
## 2023-02-15 ENCOUNTER — Encounter (HOSPITAL_BASED_OUTPATIENT_CLINIC_OR_DEPARTMENT_OTHER): Payer: Self-pay | Admitting: Urology

## 2023-02-15 ENCOUNTER — Emergency Department (HOSPITAL_BASED_OUTPATIENT_CLINIC_OR_DEPARTMENT_OTHER)
Admission: EM | Admit: 2023-02-15 | Discharge: 2023-02-15 | Disposition: A | Discharge status: Home / Self Care | Payer: Self-pay | Attending: Emergency Medicine | Admitting: Emergency Medicine

## 2023-02-15 DIAGNOSIS — H1033 Unspecified acute conjunctivitis, bilateral: Secondary | ICD-10-CM

## 2023-02-15 DIAGNOSIS — H1089 Other conjunctivitis: Secondary | ICD-10-CM | POA: Insufficient documentation

## 2023-02-15 MED ORDER — POLYMYXIN B-TRIMETHOPRIM 10000-0.1 UNIT/ML-% OP SOLN: 1.0000 [drp] | OPHTHALMIC | 0 refills | Status: AC

## 2023-02-15 NOTE — Discharge Instructions (Addendum)
Your symptoms are consistent with bacterial conjunctivitis.  Recommend warm compresses with a washcloth in addition to the antibiotic eyedrops that have been prescribed.

## 2023-02-15 NOTE — ED Triage Notes (Signed)
Pt states exposure to pink eye  Right eye now red and draining x 3 days and left eye is getting red now  States feels like right eye may be scratched from rubbing irritation

## 2023-02-15 NOTE — ED Provider Notes (Signed)
[place] EMERGENCY DEPARTMENT AT [facility] Provider Note   CSN: [id number] Arrival date & time: 02/15/23  1801     History  Chief Complaint  Patient presents with   Eye Problem    Christian Beltran is a 32 y.o. male.   Eye Problem Associated symptoms: discharge, itching and redness   Associated symptoms: no photophobia      32 year old male presenting to the emergency department with concern for conjunctivitis.  His daughter had pinkeye and now he states that he has had symptoms for the past 2 to 3 days.  Initially his right eye was red and began draining purulence and now his left eye is affected as well.  He endorses some grainy irritation, no eye pain.  He does not wear contact lenses.  He denies any vision loss.  He denies any pain with extraocular movements, no fevers or chills.  He denies any significant pain or foreign body sensation.  Home Medications Prior to Admission medications   Medication Sig Start Date End Date Taking? Authorizing Provider  trimethoprim-polymyxin b (POLYTRIM) ophthalmic solution Place 1 drop into both eyes every 4 (four) hours for 7 days. 02/15/23 02/22/23 Yes Ernie Avena, MD  albuterol (PROVENTIL HFA;VENTOLIN HFA) 108 (90 Base) MCG/ACT inhaler Inhale 1-2 puffs into the lungs every 6 (six) hours as needed for wheezing or shortness of breath. 07/14/17   Hedges, Tinnie Gens, PA-C  Aspirin-Salicylamide-Caffeine (BC HEADACHE POWDER PO) Take 1 packet by mouth every 6 (six) hours as needed. Pain    [provider]  cyclobenzaprine (FLEXERIL) 10 MG tablet Take 1 tablet (10 mg total) by mouth 2 (two) times daily as needed for muscle spasms. 09/16/20   Tilden Fossa, MD  ibuprofen (ADVIL,MOTRIN) 600 MG tablet Take 1 tablet (600 mg total) by mouth every 6 (six) hours as needed. 10/26/16   Nira Conn, MD      Allergies    Patient has no known allergies.    Review of Systems   Review of Systems  Eyes:  Positive for discharge,  redness and itching. Negative for photophobia, pain and visual disturbance.  All other systems reviewed and are negative.   Physical Exam Updated Vital Signs BP (!) 172/123 (BP Location: Left Arm)   Pulse 90   Temp 98.5 F (36.9 C) (Oral)   Resp 20   Ht 6\' 2"  (1.88 m)   Wt 70.3 kg   SpO2 99%   BMI 19.90 kg/m  Physical Exam Vitals and nursing note reviewed.  Constitutional:      General: He is not in acute distress. HENT:     Head: Normocephalic and atraumatic.  Eyes:     General: Vision grossly intact. No visual field deficit.       Right eye: Discharge present.        Left eye: Discharge present.    Extraocular Movements: Extraocular movements intact.     Right eye: Normal extraocular motion.     Left eye: Normal extraocular motion.     Conjunctiva/sclera:     Right eye: Right conjunctiva is injected.     Left eye: Left conjunctiva is injected.     Pupils: Pupils are equal, round, and reactive to light.  Cardiovascular:     Rate and Rhythm: Normal rate and regular rhythm.  Pulmonary:     Effort: Pulmonary effort is normal. No respiratory distress.  Abdominal:     General: There is no distension.     Tenderness: There is no guarding.  Musculoskeletal:        General: No deformity or signs of injury.     Cervical back: Neck supple.  Skin:    Findings: No lesion or rash.  Neurological:     General: No focal deficit present.     Mental Status: He is alert. Mental status is at baseline.     ED Results / Procedures / Treatments   Labs (all labs ordered are listed, but only abnormal results are displayed) Labs Reviewed - No data to display  EKG None  Radiology No results found.  Procedures Procedures    Medications Ordered in ED Medications - No data to display  ED Course/ Medical Decision Making/ A&P                             Medical Decision Making Risk Prescription drug management.     32 year old male presenting to the emergency  department with concern for conjunctivitis.  His daughter had pinkeye and now he states that he has had symptoms for the past 2 to 3 days.  Initially his right eye was red and began draining purulence and now his left eye is affected as well.  He endorses some grainy irritation, no eye pain.  He does not wear contact lenses.  He denies any vision loss.  He denies any pain with extraocular movements, no fevers or chills.  He denies any significant pain or foreign body sensation.  On arrival, the patient was vitally stable, presenting with symptoms of likely bacterial conjunctivitis.  Not a contact lens wearer, denies any eye pain, low concern for corneal abrasion.  Purulent discharge noted with conjunctival injection bilaterally in both eyes, gaze aligned appropriately, no pain with extraocular movements, vision grossly intact.  Low concern for periorbital cellulitis or orbital cellulitis at this time.  Will treat with warm compresses in addition to Polytrim drops.  Patient provided with return precautions, advised continued outpatient management.  Stable for discharge.   Final Clinical Impression(s) / ED Diagnoses Final diagnoses:  Acute bacterial conjunctivitis of both eyes    Rx / DC Orders ED Discharge Orders          Ordered    trimethoprim-polymyxin b (POLYTRIM) ophthalmic solution  Every 4 hours        02/15/23 1820              Ernie Avena, MD 02/15/23 1823

## 2023-09-16 ENCOUNTER — Emergency Department (HOSPITAL_BASED_OUTPATIENT_CLINIC_OR_DEPARTMENT_OTHER)
Admission: EM | Admit: 2023-09-16 | Discharge: 2023-09-16 | Disposition: A | Discharge status: Home / Self Care | Payer: Self-pay | Attending: Emergency Medicine | Admitting: Emergency Medicine

## 2023-09-16 ENCOUNTER — Other Ambulatory Visit: Payer: Self-pay

## 2023-09-16 ENCOUNTER — Encounter (HOSPITAL_BASED_OUTPATIENT_CLINIC_OR_DEPARTMENT_OTHER): Payer: Self-pay

## 2023-09-16 DIAGNOSIS — Z113 Encounter for screening for infections with a predominantly sexual mode of transmission: Secondary | ICD-10-CM | POA: Insufficient documentation

## 2023-09-16 DIAGNOSIS — Z202 Contact with and (suspected) exposure to infections with a predominantly sexual mode of transmission: Secondary | ICD-10-CM

## 2023-09-16 MED ORDER — CEFTRIAXONE SODIUM 500 MG IJ SOLR
500.0000 mg | Freq: Once | INTRAMUSCULAR | Status: AC
  Administered 2023-09-16: 500 mg via INTRAMUSCULAR
  Filled 2023-09-16: qty 500

## 2023-09-16 MED ORDER — LIDOCAINE HCL (PF) 1 % IJ SOLN
1.0000 mL | Freq: Once | INTRAMUSCULAR | Status: AC
  Administered 2023-09-16: 1 mL
  Filled 2023-09-16: qty 5

## 2023-09-16 MED ORDER — AZITHROMYCIN 250 MG PO TABS
1000.0000 mg | ORAL_TABLET | Freq: Once | ORAL | Status: AC
  Administered 2023-09-16: 1000 mg via ORAL
  Filled 2023-09-16: qty 4

## 2023-09-16 NOTE — ED Triage Notes (Signed)
The patient stated his partner was dx with chlamydia.

## 2023-09-16 NOTE — Discharge Instructions (Addendum)
You came into the emergency department for concern of chlamydia, the testing for chlamydia and gonorrhea is in process. We will call if there are any abnormal results. You received an injection of Rocephin (an antibiotic) and azithromycin. It is important that you call the number above to establish care with a PCP. You will need testing in 3 months to check for chlamydia. Remember to use condoms during sexual encounters (both oral and vaginal).

## 2023-09-16 NOTE — ED Provider Notes (Signed)
[place] EMERGENCY DEPARTMENT AT [facility] Provider Note   CSN: [id number] Arrival date & time: 09/16/23  1853     History  Chief Complaint  Patient presents with   SEXUALLY TRANSMITTED DISEASE    Christian Beltran is a 32 y.o. male with a history of previously treated trichomonas and chlamydia who presented to the emergency department for evaluation of chlamydia because his ex sexual partner was diagnosed with chlamydia.  He stated that he is not experiencing increased urinary frequency and penile discharge was not purulent or discolored.  He denies genital sore or lesions.  He denies other STI concerns at this moment.      Home Medications Prior to Admission medications   Medication Sig Start Date End Date Taking? Authorizing Provider  albuterol (PROVENTIL HFA;VENTOLIN HFA) 108 (90 Base) MCG/ACT inhaler Inhale 1-2 puffs into the lungs every 6 (six) hours as needed for wheezing or shortness of breath. 07/14/17   Hedges, Tinnie Gens, PA-C  Aspirin-Salicylamide-Caffeine (BC HEADACHE POWDER PO) Take 1 packet by mouth every 6 (six) hours as needed. Pain    [provider]  cyclobenzaprine (FLEXERIL) 10 MG tablet Take 1 tablet (10 mg total) by mouth 2 (two) times daily as needed for muscle spasms. 09/16/20   Tilden Fossa, MD  ibuprofen (ADVIL,MOTRIN) 600 MG tablet Take 1 tablet (600 mg total) by mouth every 6 (six) hours as needed. 10/26/16   Nira Conn, MD      Allergies    Patient has no known allergies.    Review of Systems   Review of Systems  Genitourinary:  Positive for frequency and penile discharge. Negative for genital sores.    Physical Exam Updated Vital Signs BP (!) 146/94 (BP Location: Left Arm)   Pulse 79   Temp 98.6 F (37 C) (Oral)   Resp 16   Ht 6\' 2"  (1.88 m)   Wt 70 kg   SpO2 100%   BMI 19.81 kg/m  Physical Exam Vitals and nursing note reviewed.  Constitutional:      General: He is not in acute distress.    Appearance:  He is not ill-appearing, toxic-appearing or diaphoretic.  Cardiovascular:     Rate and Rhythm: Normal rate and regular rhythm.  Pulmonary:     Effort: Pulmonary effort is normal.     Breath sounds: Normal breath sounds.  Abdominal:     General: Bowel sounds are normal. There is no distension.     Palpations: Abdomen is soft.     Tenderness: There is no abdominal tenderness. There is no guarding.  Genitourinary:    Penis: Normal. No erythema, tenderness, swelling or lesions.   Skin:    General: Skin is warm and dry.  Neurological:     Mental Status: He is alert.     ED Results / Procedures / Treatments   Labs (all labs ordered are listed, but only abnormal results are displayed) Labs Reviewed  GC/CHLAMYDIA PROBE AMP ([place]) NOT AT [facility]    EKG None  Radiology No results found.  Procedures Procedures    Medications Ordered in ED Medications  cefTRIAXone (ROCEPHIN) injection 500 mg (500 mg Intramuscular Given 09/16/23 2043)  lidocaine (PF) (XYLOCAINE) 1 % injection 1-2.1 mL (1 mL Other Given 09/16/23 2044)  azithromycin (ZITHROMAX) tablet 1,000 mg (1,000 mg Oral Given 09/16/23 2042)    ED Course/ Medical Decision Making/ A&P  Medical Decision Making Patient is a 32 year old male with a past medical history of treated chlamydia and trichomonas who presents emergency department for concern of chlamydia infection. External penile exam did not reveal lesions or chancre. Urine GC/Chlamydia was collected. Rocephin 500 mg was administered IM and 1,000mg  azithromycin.  Pt denied additional STI screening.   Amount and/or Complexity of Data Reviewed Labs: ordered.    Details: GC/Chlamydia urine, results processing   Risk Prescription drug management.           Final Clinical Impression(s) / ED Diagnoses Final diagnoses:  Possible exposure to STI    Rx / DC Orders ED Discharge Orders     None         Faith Rogue,  DO 09/16/23 1610    Jacalyn Lefevre, MD 09/16/23 2144

## 2023-09-19 LAB — GC/CHLAMYDIA PROBE AMP (~~LOC~~) NOT AT ARMC
Chlamydia: NEGATIVE
Comment: NEGATIVE
Comment: NORMAL
Neisseria Gonorrhea: NEGATIVE
